# Patient Record
Sex: Male | Born: 1945 | Race: White | Hispanic: No | Smoking: Never smoker
Health system: Southern US, Community
[De-identification: ages and names within clinical notes are randomized; demographics above are authoritative.]

## PROBLEM LIST (undated history)

## (undated) DIAGNOSIS — J45909 Unspecified asthma, uncomplicated: Secondary | ICD-10-CM

## (undated) DIAGNOSIS — I10 Essential (primary) hypertension: Secondary | ICD-10-CM

## (undated) DIAGNOSIS — U071 COVID-19: Secondary | ICD-10-CM

## (undated) DIAGNOSIS — C73 Malignant neoplasm of thyroid gland: Secondary | ICD-10-CM

## (undated) DIAGNOSIS — I509 Heart failure, unspecified: Secondary | ICD-10-CM

## (undated) DIAGNOSIS — C61 Malignant neoplasm of prostate: Secondary | ICD-10-CM

## (undated) DIAGNOSIS — E785 Hyperlipidemia, unspecified: Secondary | ICD-10-CM

## (undated) DIAGNOSIS — I480 Paroxysmal atrial fibrillation: Secondary | ICD-10-CM

## (undated) HISTORY — PX: THYROIDECTOMY: SHX17

## (undated) HISTORY — PX: KNEE SURGERY: SHX244

## (undated) HISTORY — DX: Hyperlipidemia, unspecified: E78.5

## (undated) HISTORY — DX: Paroxysmal atrial fibrillation: I48.0

## (undated) HISTORY — DX: Unspecified asthma, uncomplicated: J45.909

## (undated) HISTORY — PX: PROSTATECTOMY: SHX69

---

## 2008-09-30 ENCOUNTER — Emergency Department (HOSPITAL_BASED_OUTPATIENT_CLINIC_OR_DEPARTMENT_OTHER): Admission: EM | Admit: 2008-09-30 | Discharge: 2008-09-30 | Payer: Self-pay | Admitting: Emergency Medicine

## 2008-09-30 ENCOUNTER — Ambulatory Visit: Payer: Self-pay | Admitting: Diagnostic Radiology

## 2008-11-24 ENCOUNTER — Ambulatory Visit: Payer: Self-pay | Admitting: Cardiovascular Disease

## 2008-11-24 ENCOUNTER — Ambulatory Visit: Payer: Self-pay | Admitting: Diagnostic Radiology

## 2008-11-24 ENCOUNTER — Encounter: Payer: Self-pay | Admitting: Emergency Medicine

## 2008-11-24 ENCOUNTER — Inpatient Hospital Stay (HOSPITAL_COMMUNITY): Admission: EM | Admit: 2008-11-24 | Discharge: 2008-11-26 | Payer: Self-pay | Admitting: Cardiovascular Disease

## 2008-12-03 ENCOUNTER — Ambulatory Visit: Payer: Self-pay

## 2008-12-03 ENCOUNTER — Encounter: Payer: Self-pay | Admitting: Cardiology

## 2009-01-13 ENCOUNTER — Telehealth: Payer: Self-pay | Admitting: Family Medicine

## 2009-01-14 ENCOUNTER — Inpatient Hospital Stay (HOSPITAL_COMMUNITY): Admission: EM | Admit: 2009-01-14 | Discharge: 2009-01-16 | Payer: Self-pay | Admitting: Emergency Medicine

## 2009-01-14 ENCOUNTER — Ambulatory Visit: Payer: Self-pay | Admitting: Cardiology

## 2009-01-14 ENCOUNTER — Telehealth: Payer: Self-pay | Admitting: Cardiology

## 2009-01-16 ENCOUNTER — Telehealth: Payer: Self-pay | Admitting: Cardiology

## 2009-01-18 DIAGNOSIS — C73 Malignant neoplasm of thyroid gland: Secondary | ICD-10-CM | POA: Insufficient documentation

## 2009-01-18 DIAGNOSIS — E785 Hyperlipidemia, unspecified: Secondary | ICD-10-CM | POA: Insufficient documentation

## 2009-01-18 DIAGNOSIS — I4891 Unspecified atrial fibrillation: Secondary | ICD-10-CM | POA: Insufficient documentation

## 2009-01-22 ENCOUNTER — Ambulatory Visit: Payer: Self-pay | Admitting: Cardiology

## 2009-01-22 DIAGNOSIS — F1010 Alcohol abuse, uncomplicated: Secondary | ICD-10-CM | POA: Insufficient documentation

## 2009-01-23 ENCOUNTER — Telehealth: Payer: Self-pay | Admitting: Cardiology

## 2009-03-07 ENCOUNTER — Telehealth (INDEPENDENT_AMBULATORY_CARE_PROVIDER_SITE_OTHER): Payer: Self-pay | Admitting: *Deleted

## 2009-03-08 ENCOUNTER — Telehealth: Payer: Self-pay | Admitting: Cardiology

## 2009-03-26 ENCOUNTER — Telehealth: Payer: Self-pay | Admitting: Cardiology

## 2009-04-09 ENCOUNTER — Telehealth (INDEPENDENT_AMBULATORY_CARE_PROVIDER_SITE_OTHER): Payer: Self-pay | Admitting: *Deleted

## 2009-04-19 ENCOUNTER — Telehealth: Payer: Self-pay | Admitting: Cardiology

## 2009-04-25 ENCOUNTER — Telehealth: Payer: Self-pay | Admitting: Cardiology

## 2009-04-30 ENCOUNTER — Telehealth (INDEPENDENT_AMBULATORY_CARE_PROVIDER_SITE_OTHER): Payer: Self-pay | Admitting: *Deleted

## 2009-04-30 DIAGNOSIS — R079 Chest pain, unspecified: Secondary | ICD-10-CM | POA: Insufficient documentation

## 2009-05-01 ENCOUNTER — Ambulatory Visit: Payer: Self-pay

## 2009-05-01 ENCOUNTER — Encounter: Payer: Self-pay | Admitting: Internal Medicine

## 2009-05-08 ENCOUNTER — Ambulatory Visit: Payer: Self-pay | Admitting: Cardiology

## 2009-05-21 ENCOUNTER — Telehealth: Payer: Self-pay | Admitting: Cardiology

## 2009-06-17 ENCOUNTER — Encounter: Admission: RE | Admit: 2009-06-17 | Discharge: 2009-06-17 | Payer: Self-pay | Admitting: Unknown Physician Specialty

## 2009-12-24 ENCOUNTER — Encounter: Admission: RE | Admit: 2009-12-24 | Discharge: 2009-12-24 | Payer: Self-pay | Admitting: Unknown Physician Specialty

## 2010-08-05 ENCOUNTER — Telehealth (INDEPENDENT_AMBULATORY_CARE_PROVIDER_SITE_OTHER): Payer: Self-pay | Admitting: *Deleted

## 2010-10-14 NOTE — Progress Notes (Signed)
  Request received from [facility] sent to [facility]  August 05, 2010 8:48 AM      Appended Document:  Received 2nd request from [facility] sent to [facility]...also spoke with Bronson Ing this am she was trying to figure out why no records have been sent..I advised her the 1st request that was sent I forwarded to [facility] on 08/05/10.she said she has contacted [facility] plenty of times and keeps getting the run around.I have forwarded the 2nd request to [facility].

## 2010-12-23 LAB — CBC
HCT: 42.5 % (ref 39.0–52.0)
HCT: 42.7 % (ref 39.0–52.0)
HCT: 46 % (ref 39.0–52.0)
Hemoglobin: 15 g/dL (ref 13.0–17.0)
Hemoglobin: 16.1 g/dL (ref 13.0–17.0)
MCHC: 34.4 g/dL (ref 30.0–36.0)
MCHC: 35 g/dL (ref 30.0–36.0)
MCV: 91.3 fL (ref 78.0–100.0)
MCV: 91.3 fL (ref 78.0–100.0)
MCV: 91.8 fL (ref 78.0–100.0)
Platelets: 158 10*3/uL (ref 150–400)
Platelets: 170 10*3/uL (ref 150–400)
RBC: 4.68 MIL/uL (ref 4.22–5.81)
RDW: 13.6 % (ref 11.5–15.5)
WBC: 5.1 10*3/uL (ref 4.0–10.5)

## 2010-12-23 LAB — DIFFERENTIAL
Basophils Absolute: 0 10*3/uL (ref 0.0–0.1)
Basophils Relative: 1 % (ref 0–1)
Eosinophils Absolute: 0.2 10*3/uL (ref 0.0–0.7)
Eosinophils Relative: 5 % (ref 0–5)
Lymphocytes Relative: 27 % (ref 12–46)
Lymphs Abs: 1.4 10*3/uL (ref 0.7–4.0)
Monocytes Relative: 10 % (ref 3–12)

## 2010-12-23 LAB — CK TOTAL AND CKMB (NOT AT ARMC)
CK, MB: 3.2 ng/mL (ref 0.3–4.0)
Total CK: 168 U/L (ref 7–232)
Total CK: 174 U/L (ref 7–232)
Total CK: 187 U/L (ref 7–232)

## 2010-12-23 LAB — URINALYSIS, ROUTINE W REFLEX MICROSCOPIC
Glucose, UA: NEGATIVE mg/dL
Hgb urine dipstick: NEGATIVE
Ketones, ur: NEGATIVE mg/dL
Nitrite: NEGATIVE
Protein, ur: NEGATIVE mg/dL
Urobilinogen, UA: 0.2 mg/dL (ref 0.0–1.0)
pH: 6.5 (ref 5.0–8.0)

## 2010-12-23 LAB — MAGNESIUM: Magnesium: 2.3 mg/dL (ref 1.5–2.5)

## 2010-12-23 LAB — POCT CARDIAC MARKERS: CKMB, poc: 1.5 ng/mL (ref 1.0–8.0)

## 2010-12-23 LAB — HEPARIN LEVEL (UNFRACTIONATED)
Heparin Unfractionated: 0.46 IU/mL (ref 0.30–0.70)
Heparin Unfractionated: <0.1 IU/mL — ABNORMAL LOW (ref 0.30–0.70)

## 2010-12-23 LAB — BASIC METABOLIC PANEL
BUN: 24 mg/dL — ABNORMAL HIGH (ref 6–23)
Calcium: 7.1 mg/dL — ABNORMAL LOW (ref 8.4–10.5)
Calcium: 8.1 mg/dL — ABNORMAL LOW (ref 8.4–10.5)
Chloride: 105 mEq/L (ref 96–112)
Creatinine, Ser: 1.13 mg/dL (ref 0.4–1.5)
GFR calc Af Amer: >60 mL/min (ref >60)
GFR calc Af Amer: >60 mL/min (ref >60)
Glucose, Bld: 141 mg/dL — ABNORMAL HIGH (ref 70–99)
Potassium: 3.9 mEq/L (ref 3.5–5.1)
Sodium: 138 mEq/L (ref 135–145)

## 2010-12-23 LAB — PROTIME-INR: Prothrombin Time: 13.6 seconds (ref 11.6–15.2)

## 2010-12-25 LAB — CBC
HCT: 50.5 % (ref 39.0–52.0)
Hemoglobin: 16.3 g/dL (ref 13.0–17.0)
MCHC: 34.5 g/dL (ref 30.0–36.0)
MCV: 93 fL (ref 78.0–100.0)
Platelets: 179 10*3/uL (ref 150–400)
Platelets: 141 10*3/uL — ABNORMAL LOW (ref 150–400)
RBC: 5.05 MIL/uL (ref 4.22–5.81)
RDW: 13 % (ref 11.5–15.5)
RDW: 13.5 % (ref 11.5–15.5)
WBC: 9.6 10*3/uL (ref 4.0–10.5)
WBC: 4.9 10*3/uL (ref 4.0–10.5)
WBC: 5.7 10*3/uL (ref 4.0–10.5)

## 2010-12-25 LAB — LIPID PANEL
LDL Cholesterol: 119 mg/dL — ABNORMAL HIGH (ref 0–99)
Total CHOL/HDL Ratio: 4.8 RATIO
VLDL: 30 mg/dL (ref 0–40)

## 2010-12-25 LAB — COMPREHENSIVE METABOLIC PANEL
AST: 41 U/L — ABNORMAL HIGH (ref 0–37)
Albumin: 4.8 g/dL (ref 3.5–5.2)
Alkaline Phosphatase: 75 U/L (ref 39–117)
BUN: 21 mg/dL (ref 6–23)
Chloride: 105 mEq/L (ref 96–112)
Creatinine, Ser: 1.1 mg/dL (ref 0.4–1.5)
GFR calc Af Amer: >60 mL/min (ref >60)
Potassium: 3.7 mEq/L (ref 3.5–5.1)
Total Bilirubin: 0.7 mg/dL (ref 0.3–1.2)
Total Protein: 8 g/dL (ref 6.0–8.3)

## 2010-12-25 LAB — POCT CARDIAC MARKERS
CKMB, poc: 1.6 ng/mL (ref 1.0–8.0)
Myoglobin, poc: 90.8 ng/mL (ref 12–200)
Myoglobin, poc: 97.5 ng/mL (ref 12–200)

## 2010-12-25 LAB — T4: T4, Total: 9.9 ug/dL (ref 5.0–12.5)

## 2010-12-25 LAB — URINALYSIS, ROUTINE W REFLEX MICROSCOPIC
Nitrite: NEGATIVE
Protein, ur: NEGATIVE mg/dL
Specific Gravity, Urine: 1.01 (ref 1.005–1.030)
Urobilinogen, UA: 0.2 mg/dL (ref 0.0–1.0)

## 2010-12-25 LAB — DIFFERENTIAL
Eosinophils Relative: 2 % (ref 0–5)
Lymphocytes Relative: 16 % (ref 12–46)
Monocytes Absolute: 0.7 10*3/uL (ref 0.1–1.0)
Monocytes Relative: 7 % (ref 3–12)
Neutro Abs: 6.9 10*3/uL (ref 1.7–7.7)

## 2010-12-25 LAB — BASIC METABOLIC PANEL
BUN: 19 mg/dL (ref 6–23)
CO2: 27 mEq/L (ref 19–32)
Calcium: 7.4 mg/dL — ABNORMAL LOW (ref 8.4–10.5)
Chloride: 107 mEq/L (ref 96–112)
Creatinine, Ser: 1.03 mg/dL (ref 0.4–1.5)

## 2010-12-25 LAB — CARDIAC PANEL(CRET KIN+CKTOT+MB+TROPI)
CK, MB: 3.3 ng/mL (ref 0.3–4.0)
Relative Index: 1.3 (ref 0.0–2.5)
Relative Index: 1.4 (ref 0.0–2.5)
Relative Index: 1.4 (ref 0.0–2.5)
Total CK: 182 U/L (ref 7–232)
Total CK: 233 U/L — ABNORMAL HIGH (ref 7–232)
Troponin I: <0.01 ng/mL (ref 0.00–0.06)
Troponin I: <0.01 ng/mL (ref 0.00–0.06)

## 2010-12-29 LAB — BASIC METABOLIC PANEL
Calcium: 7.6 mg/dL — ABNORMAL LOW (ref 8.4–10.5)
Creatinine, Ser: 1 mg/dL (ref 0.4–1.5)
GFR calc Af Amer: >60 mL/min (ref >60)

## 2011-01-27 NOTE — Consult Note (Signed)
NAME:  Barry, Escobar NO.:  [id number]   MEDICAL RECORD NO.:  [id number]          Escobar TYPE:  INP   LOCATION:  2923                         FACILITY:  [facility]   PHYSICIAN:  Hillis Range, MD       DATE OF BIRTH:  Oct 22, 1945   DATE OF CONSULTATION:  DATE OF DISCHARGE:  01/16/2009                                 CONSULTATION   REQUESTING PHYSICIAN:  Rollene Rotunda, MD, [facility]   REASON FOR CONSULTATION:  Atrial fibrillation and atrial flutter  management.   HISTORY OF PRESENT ILLNESS:  Barry Escobar is a pleasant 65 year old  gentleman with a history of prior thyroid cancer, status post thyroid  resection, heavy alcohol consumption, and paroxysmal atrial fibrillation  who was admitted with symptomatic atrial flutter.  Barry Escobar reports  initially being diagnosed with atrial fibrillation after presenting on  March 13 with symptomatic palpitations, shortness of breath, and  fatigue.  At that time, he was documented to have atrial fibrillation  with elevated ventricular rates.  He was initiated on a diltiazem drip  and converted to sinus rhythm spontaneously.  He was not initiated on  antiarrhythmic medications and did very well until Jan 13, 2009, when he  developed recurrent symptomatic palpitations upon waking after an  episode of heavy alcohol binging.  He notes again recurrent palpitations  with fatigue and decreased exercise tolerance.  He was brought to [facility] where he was found to have atrial flutter with rapid  ventricular rates.  He was again initiated on a diltiazem drip and  converted to sinus rhythm.  During this hospital stay, he underwent left  heart catheterization, which revealed normal coronary arteries with a  preserved ejection fraction.  He is presently in sinus rhythm and doing  well.   On further discussion with Barry Escobar, he denies any other symptomatic  episodes of atrial arrhythmias.  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  He reports that his lifestyle is  very chaotic with minimal sleep and prolonged international flying.  He  also reports significant stress at home and his relationship with his  girlfriend.  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  He is otherwise without complaint at this time.   PAST MEDICAL HISTORY:  1. Thyroid cancer in 2008, status post thyroid resection.  2. Iatrogenic hypothyroidism.  3. Paroxysmal atrial fibrillation and atrial flutter.  4. Hyperlipidemia.   ALLERGIES:  No known drug allergies.   HOME MEDICATIONS:  1. Synthroid 175 mcg daily.  2. Calcium 2 tablets b.i.d.  3. Lipitor 40 mg nightly.   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  He flies internationally.  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  He drinks heavy alcohol.  He  denies tobacco or drug use.  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   FAMILY HISTORY:  Barry Escobar's mother died at age 76 of cancer.  He is  unaware of any family history of  arrhythmias or sudden death.   REVIEW OF SYSTEMS:  All systems are reviewed and negative except as  outlined in Barry HPI above.   PHYSICAL EXAMINATION:  VITALS:  Blood pressure 116/80, heart rate 82,  respirations 18, sats 100% on room air, afebrile  GENERAL:  Barry Escobar is a well-appearing obese male in no acute  distress.  He is alert and oriented x3.  HEENT:  Normocephalic and atraumatic.  Sclerae clear.  Conjunctivae  pink.  Oropharynx clear.  NECK:  Supple.  Barry Escobar is status post thyroid resection.  No JVD or  bruits.  LUNGS:  Clear to auscultation bilaterally.  HEART:  Regular rate and rhythm.  No murmurs, rubs, or gallops.  GI:  Soft, nontender, and nondistended.  Positive bowel sounds.  EXTREMITIES:  No clubbing, cyanosis, or edema.  NEUROLOGIC:  Cranial nerve II through XII are intact.  Strength and  sensation  are intact.  SKIN:  No ecchymosis or lacerations.  MUSCULOSKELETAL:  No deformity or atrophy.  PSYCHIATRY:  Euthymic mood.  Full affect.   EKG from November 14, 2008, reveals atrial flutter with an average  ventricular rate 130 beats per minute.  Barry atrial flutter is not  completely typical in its appearance.  An EKG from March 14 also  documents atrial fibrillation.   LABORATORY DATA:  Creatinine 1.1.  Hematocrit 42 and platelets 170.  TSH  0.097   Transthoracic echocardiogram on December 03, 2008, reveals a left  ventricular end diastolic dimension 51, left atrial size 39, IVS 10,  posterior wall 9, left ventricular ejection fraction 65% with no wall  motion abnormalities.  There is mild mitral regurgitation with no  significant left atrial enlargement.   Chest x-ray which I personally reviewed from May 3 reveals no acute  airspace disease.   IMPRESSION:  Barry Escobar is a pleasant 65 year old gentleman with  paroxysmal atrial fibrillation and atrial flutter who presents today for  EP consultation regarding therapeutic strategies for atrial fibrillation  and atrial flutter.  Barry Escobar is clearly symptomatic with episodes of  atrial fibrillation and atrial flutter and has rapid ventricular rates.  His CHADS 2 score is 0.  Therefore, I think that Barry Escobar could be  adequately anticoagulated with aspirin 325 mg daily for stroke  prevention.  Therapeutic strategies for atrial fibrillation including  both medicine and catheter-based therapies were discussed in detail with  Barry Escobar today.  Risks, benefits, and alternatives to EP study and  radiofrequency ablation for atrial fibrillation were also discussed.  I  think at this point we should focus on lifestyle modification including  significant reduction in alcohol consumption as well as trying to adapt  to normal sleeping and stress patterns.  Barry Escobar has been initiated  on flecainide 50 mg twice daily and this will be  titrated in an  outpatient setting by Dr. Antoine Poche.  We will also place Barry Escobar on  Toprol-XL 25 mg daily for rate control.  Barry  Escobar will follow up closely with Dr. Antoine Poche.  If he fails medical  therapy with flecainide, then I think that he would be a reasonable  candidate for catheter ablation after lifestyle modifications have been  made.  Barry Escobar will follow up with me on an as-needed basis.      Hillis Range, MD  Electronically Signed     JA/MEDQ  D:  01/16/2009  T:  01/17/2009  Job:  [id number]   cc:   Rollene Rotunda, MD, [facility]

## 2011-01-27 NOTE — H&P (Signed)
NAME:  Barry Escobar, Barry Escobar NO.:  [id number]   MEDICAL RECORD NO.:  [id number]          PATIENT TYPE:  INP   LOCATION:  2918                         FACILITY:  [facility]   PHYSICIAN:  Christell Faith, MD   DATE OF BIRTH:  May 04, 1946   DATE OF ADMISSION:  11/24/2008  DATE OF DISCHARGE:                              HISTORY & PHYSICAL   ADMITTING PHYSICIAN:  Rollene Rotunda, MD, [facility], [facility].   PRIMARY CARE PHYSICIAN:  Dr. Sharee Pimple.   ENDOCRINOLOGIST:  Lowella Grip, MD.   WORK PHYSICIAN:  Dr. Beau Fanny in [place].   CHIEF COMPLAINT:  Chest pain and racing heart.   HISTORY OF PRESENT ILLNESS:  This is a 65 year old white man who is a  Biochemist, clinical for [employer].  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  He does have a history of thyroid cancer and has  had a total thyroidectomy and now takes Synthroid replacement.  Of note,  his dose has been fluctuating between 175 and 200 mcg a day of  Synthroid.  This morning he was running on the treadmill, and his heart  rate remained tachycardic after exercise, which is unusual for him.  He  felt funny and actually vomited.  He proceeded to go home and work in  the yard with a chain saw, but then he felt profound racing heart,  irregular heartbeat, and mild chest tightness.  He went to the [facility] and was found to be in rapid atrial  fibrillation.  His rate was slowed with IV diltiazem, and he has had no  further chest tightness.  In retrospect for the past few weeks, he has  noticed spells of racing heart, particularly after drinking a large cup  of caffeinated coffee.  He has no history of coronary disease.  As part  of his job, he takes treadmill test every few years and has never had an  abnormal treadmill test.  His last one was approximately 2 years ago.   PAST MEDICAL HISTORY:  1. Thyroid cancer, status post total thyroidectomy 2 years ago now on      thyroid  replacement hormone.  2. Hyperlipidemia.  3. Multiple prior normal treadmill tests.   SOCIAL HISTORY:  Lives in [place] with his girlfriend.  He is a  Occupational hygienist.  As mentioned, he is a Biochemist, clinical for [employer].  He is a lifelong  nonsmoker.  He has moderate-to-heavy caffeine use in the form of coffee.  He has 2 alcoholic drinks a day when not flying.  No drugs or other  stimulants.  He exercises on the treadmill 3 days a week.   FAMILY HISTORY:  Mother died of cancer.  Father died of prostate cancer.  No coronary disease or atrial fibrillation in the family that he knows  of.   ALLERGIES:  None.   MEDICINES:  1. Synthroid 175-200 mcg p.o. daily.  2. Lipitor 20 mg p.o. daily.   REVIEW OF SYSTEMS:  Positive for fatigue, otherwise 14 systems are  reviewed and are negative.   PHYSICAL EXAMINATION:  VITAL  SIGNS:  Temperature 97.2, pulse was  initially 145 and is now 81, respiratory rate 18, blood pressure was  initially 147/92 and is now 124/79, saturation 100% on 2 L.  GENERAL:  He is a very pleasant white man, in no acute distress.  HEENT:  Pupils are round and reactive.  Sclerae are clear.  Mucous  membranes are moist.  Dentition is good.  NECK:  Supple.  Neck veins are flat.  No carotid bruits.  There is a  well-healed thyroid collar scar.  No cervical adenopathy.  CARDIAC:  The rhythm is irregularly irregular, rate controlled.  No  murmur.  LUNGS:  Clear to auscultation bilaterally without wheezing or rales.  ABDOMEN:  Soft, nontender, nondistended.  No bruits.  Normal bowel  sounds.  EXTREMITIES:  No edema.  No clubbing or cyanosis.  SKIN:  No rash or erythema.  MUSCULOSKELETAL:  No acute joint effusions or deformities.  NEUROLOGIC:  Awake, alert, oriented x3.  5/5 strength in all 4  extremities.  Normal sensation.  Facial expressions are symmetric and  intact.   DIAGNOSTIC TESTS:  Chest x-ray reports a suggestion of cardiomegaly,  looks actually normal to me.  EKG, currently  atrial fibrillation 79  beats a minute with nonspecific ST and T-wave changes.  Initial EKG at  [facility] documented atrial fibrillation with a rate of 136 beats per  minute.   LABS:  White blood cell 9.6, hemoglobin 17, hematocrit 50, platelets  179.  Sodium 146, potassium 3.7, BUN 21, creatinine 1.1, glucose 93, CK-  MB 1.6, troponin less than 0.05.   IMPRESSION:  This is a 65 year old white man with new-onset atrial  fibrillation.  He had symptoms today of racing heart and chest tightness  but is now asymptomatic with rate control.   PLAN:  1. He has been admitted to the step-down unit to a monitored bed.  We      will cycle cardiac enzymes and EKGs throughout the night.  We will      plan to check a surface echocardiogram tomorrow.  2. He will be anticoagulated with Lovenox 1 mg/kg subcu q.12 h. and      will also be given aspirin.  For the time being, we will continue      to use IV diltiazem for rate control.  3. We will check TSH and free T4, and there is at least moderate      likelihood that the atrial fibrillation is related to increased      Synthroid dose recently.  4. He will need a possible TEE cardioversion on Monday if he has not      converted by then.  A decision will have to be made whether to      treat him long-term with aspirin or Coumadin.  5. He will probably require some sort of ischemic evaluation, either a      stress test or a cardiac catheterization.  6. The combination of polycythemia, hypernatremia, and BUN to      creatinine ratio makes me think he is probably dehydrated, and he      will be given IV fluids overnight tonight.  7. 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  He receives his annual flight physical by Dr.      Beau Fanny in [place] who will probably need to be contacted at      some point to determine if any specific  evaluation is required      given his occupation.     Christell Faith, MD  Electronically Signed    NDL/MEDQ  D:  11/24/2008  T:  11/25/2008  Job:  [id number]

## 2011-01-27 NOTE — Discharge Summary (Signed)
NAME:  DAERON, CARRENO NO.:  [id number]   MEDICAL RECORD NO.:  [id number]          PATIENT TYPE:  INP   LOCATION:  2923                         FACILITY[facility]   PHYSICIAN:  Hillis Range, MD       DATE OF BIRTH:  May 15, 1946   DATE OF ADMISSION:  01/14/2009  DATE OF DISCHARGE:  01/16/2009                               DISCHARGE SUMMARY   PRIMARY CARDIOLOGIST:  Rollene Rotunda, MD, FACC   ELECTROPHYSIOLOGY:  Hillis Range, MD   PRIMARY CARE Frazier Balfour:  Morton Stall.   ENDOCRINOLOGIST:  Satira Mccallum in [place], [place].   OPERATING ROOM PHYSICIAN:  Erskine Squibb in [place].   DISCHARGING DIAGNOSES:  1. Atrial fibrillation with rapid ventricular response/atrial flutter,      treated with Cardizem status post Electrophysiology evaluation by      Dr. Hillis Range this admission.  Recommendations for Toprol-XL,      flecainide therapy at this time, full strength aspirin, CHADS score      0, follow up with Dr. Antoine Poche in outpatient, recommendation also      for lifestyle modification including decreased ethyl alcohol      consumption, decreased caffeine consumption, and more effective      sleep pattern.  2. Status post cardiac catheterization this admission showing no      significant obstruction, probable normal left ventricular function,      60%.  3. Subtherapeutic TSH of 0.097 with a history of iatrogenic      hypothyroidism following a thyroid cancer/thyroidectomy.  4. Dyslipidemia, on statin therapy.   HOSPITAL COURSE:  Mr. Barry Escobar is a 65 year old Caucasian gentleman with  a new diagnosis of atrial fib in March 2010 where he spontaneously  converted.  He returned this admission.  Similar situation after  attending several social events and greater than usual EtOH intake, he  experienced some rapid heart rates, palpitations, and presented to the  emergency room for further evaluation.  He denied any episodes of chest  discomfort; however,  with a diagnosis of atrial fibrillation.  The  patient needed further evaluation to rule out coronary artery disease.  The patient was started on IV Cardizem, taken to the cath lab on Jan 15, 2009, for further evaluation.  The patient returned to normal sinus  rhythm.  Cardiac catheterization results as stated above.  The patient  tolerated the procedure without complications.  Dr. Johney Frame was asked to  consult for Electrophysiology evaluation and saw the patient in  consultation on Jan 16, 2009.  Further discussion with the patient and  Dr. Antoine Poche, Dr. Johney Frame decided to proceed with Toprol-XL 25 mg daily  and flecainide 50 mg b.i.d. with a lifestyle modification.  Follow up  with Dr. Antoine Poche and no further up with Dr. Johney Frame at this time unless  Dr. Antoine Poche deems necessary.   MEDICATIONS AT THE TIME OF DISCHARGE:  1. Toprol-XL 25 mg daily.  2. Flecainide 50 mg b.i.d.  3. Aspirin 325 mg daily.  4. Levothyroxine, note will continue the 175 mcg daily now.  The      patient needs to follow  up with Dr. Jimmye Norman for adjustment of his      medication.  5. Lipitor 20 mg daily.   The patient has been given the postcardiac catheterization discharge  instructions.   OTHER PERTINENT LABORATORY WORK AT THE TIME OF DISCHARGE:  Potassium  3.9, glucose 114, creatinine 1.13.  TSH of 0.097 as stated.  Urinalysis  was negative.  Magnesium 2.3.  WBC 6.2, hemoglobin 14.6, hematocrit  42.5, platelet count 170,000.   DURATION OF DISCHARGE ENCOUNTER:  30 minutes.      Dorian Pod, ACNP      Hillis Range, MD  Electronically Signed    MB/MEDQ  D:  01/16/2009  T:  01/17/2009  Job:  [id number]   cc:   Dr. Harl Bowie  Dr. Satira Mccallum  Dr Alisa Graff, MD, [facility]

## 2011-01-27 NOTE — Cardiovascular Report (Signed)
NAMEARNEL, WYMER NO.:  [id number]   MEDICAL RECORD NO.:  [id number]          PATIENT TYPE:  INP   LOCATION:  2923                         [facility]ACILITY[facility]   PHYSICIAN:  Arturo Morton. Riley Kill, MD, FACCDATE OF BIRTH:  October 16, 1945   DATE OF PROCEDURE:  01/15/2009  DATE OF DISCHARGE:                            CARDIAC CATHETERIZATION   INDICATIONS:  Mr. Monte is a 65 year old gentleman who has presented  with palpitations and malaise.  He was noted to be in atrial flutter.  He has previously been in atrial fib.  Echo has revealed normal ejection  fraction.  He was seen by Dr. Myrtis Ser and Dr. Antoine Poche and cardiac  catheterization was recommended.  Risks, benefits, and alternatives were  discussed with the patient in detail.  He consented to proceed.   PROCEDURES:  1. Left heart catheterization.  2. Selective coronary arteriography.  3. Selective left ventriculography.   DESCRIPTION OF PROCEDURE:  The patient was brought to the  catheterization laboratory and prepped and draped in the usual fashion.  Through an anterior puncture, the right femoral artery was easily  entered.  A 5-French sheath was then placed.  Standard Judkins catheter  was utilized to engage the left coronary artery.  The right coronary  artery was opacified using a [employer] Ship.  This was nonselectively  engaged, but opacification was excellent.  Central aortic and left  ventricular pressures were measured with a pigtail catheter and  ventriculography was performed in the RAO projection.  ACT was 197  seconds and therefore the femoral sheath was sewn into place and he was  taken to the holding area for sheath removal in approximately 30  minutes.  There were no major complications.  He tolerated the procedure  well, 1 mg of intravenous midazolam, and 25 mg of fentanyl were given  for sedation.   HEMODYNAMIC DATA:  1. The central aortic pressure was 114/69.  2. Left ventricular pressure  118/3.  3. There was no gradient or pullback across the aortic valve.   ANGIOGRAPHIC DATA:  1. On plain fluoroscopy, there was no evidence of any calcification.  2. Ventriculography done in the RAO projection reveals vigorous global      systolic function.  It is difficult to evaluate wall motion because      the patient is in atrial flutter.  Ejection fraction could not be      calculated precisely because of this.  3. The left main is large and free of disease.  4. The LAD courses to the apex.  There are 2 smaller diagonal branches      associated with the LAD.  I do not see any significant      calcification.  The apical LAD is without significant narrowing.      The two small diagonals likewise are without significant narrowing.  5. The circumflex is a large-caliber vessel.  It is a codominant      vessel providing 2 large posterior branches, which occupied a      significant portion of the inferior wall.  There is a third  additional branch.  There are 2 more proximal marginals all are      without significant disease.  6. The right coronary artery is a small codominant vessel providing a      single PDA.  It is without significant narrowing.   CONCLUSIONS:  1. Atrial flutter with good control of ventricular rate presently on      intravenous diltiazem.  2. Preserved overall LV systolic function.  3. No significant evidence of high-grade focal coronary obstruction.   DISPOSITION:  The patient will be taken back to the holding area in  approximately 12 hours.  He will be started back on fairly low-dose  anticoagulation.  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      Arturo Morton. Riley Kill, MD, [facility]  Electronically Signed     TDS/MEDQ  D:  01/15/2009  T:  01/16/2009  Job:  [id number]   cc:   Rollene Rotunda, MD, [facility]  Dr. Harl Bowie  CV Laboratory  Luis Abed, MD, [facility]

## 2011-01-27 NOTE — Discharge Summary (Signed)
NAME:  Barry Escobar, Barry Escobar NO.:  [id number]   MEDICAL RECORD NO.:  [id number]          PATIENT TYPE:  INP   LOCATION:  3738                         FACILITY:  [facility]   PHYSICIAN:  Rollene Rotunda, MD, FACCDATE OF BIRTH:  08/25/1946   DATE OF ADMISSION:  11/24/2008  DATE OF DISCHARGE:  11/26/2008                               DISCHARGE SUMMARY   PRIMARY CARDIOLOGIST:  Rollene Rotunda, MD, [facility]   PRIMARY CARE PHYSICIAN:  Harl Bowie, MD   ENDOCRINOLOGIST:  Satira Mccallum, MD   WORK PHYSICIAN:  Erskine Squibb, MD, [place]   DISCHARGE DIAGNOSES:  1. Atrial fibrillation.  2. Chest pain (ruled out for acute coronary syndrome).   SECONDARY DIAGNOSES:  1. Hypothyroidism.  2. Hyperlipidemia.   ALLERGIES:  NKDA.   PROCEDURES PERFORMED DURING THIS HOSPITALIZATION:  On November 24, 2008,  the patient had EKG performed that showed atrial fibrillation with  ventricular response of 79 beats per minute, no acute ST-T wave changes,  no significant Q-waves, left ventricular hypertrophy, and left axis  deviation.  The patient had an EKG performed on November 24, 2008 that  showed probable mild cardiomegaly.  The patient had EKG performed on  November 25, 2008 that showed atrial fibrillation with a ventricular  response rate of 57 beats per minute, otherwise no significant changes  from prior EKG.   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  He has a  history of thyroid cancer and is status post thyroidectomy and not takes  Synthroid replacement.  Of note, his dose has been fluctuating between  135 and 200 mcg a day of Synthroid.  This morning, he was running on the  treadmill and his heart rate remained elevated after cessation of  exercise.  The patient reports this was felt funny and was unusual.  The  patient also reported  vomiting.  He proceeded to go home and worked in  the yard with a chain saw when he felt profound tachycardia and an  irregular heartbeat with mild chest tightness.  He went to the [facility] and was found to be in rapid atrial  fibrillation.  His rate was slowed with IV diltiazem and he has had no  further chest tightness since.  In retrospect with past few weeks, he  has noticed spells of tachy palpitations, particularly after drinking a  large cup of caffeinated coffee.  He has no history of coronary artery  disease.  As part of his job, he takes a treadmill test every few years  and has never had an abnormal result.  His last one was approximately 2  years ago.   HOSPITAL COURSE:  The patient was admitted on November 24, 2008 as above  and had 3 sets of negative cardiac enzymes as well as his thyroid  function tests showing decreased TSH with normal free T4/thyroxine and  T3.  The patient's other labs including CBC and CMET were  within normal  limits except for a slightly elevated AST on admission.  The patient did  have slightly elevated CK values with his first two full sets of cardiac  enzymes.  Vital signs remained stable during hospital course and on  November 25, 2008, the patient spontaneously converted from atrial  fibrillation to normal sinus rhythm.  The patient remained asymptomatic  since admission and throughout hospital course.  The patient was deemed  stable for discharge and given followup appointments (see followup  appointment section) in both oral and written form as well as his new  medication list.  At the time of discharge, the patient had no questions  or concerns that had not been addressed.   DISCHARGE VITAL SIGNS:  Most recent vital signs on day of discharge:  Temperature 97.7 degrees Fahrenheit, BP 103/65, pulse 69, respiration  rate 16, O2 saturation 94% on room air, and weight 89.9 kg.   DISCHARGE LABORATORY DATA:  WBC 4.9, HGB 15.1, HCT  43.8, and PLT count  141.  Sodium 139, potassium 3.7, chloride 107, CO2 27, BUN 19,  creatinine 1.03 and glucose 86.  Calcium was 7.4.  The patient's  thyroxine was 9.9 and free T4 1.49.  The patient's T3/triiodothyronine  was 113.0 and TSH was 0.171.  The patient had a urinalysis that was  within normal limits.  Total cholesterol 188, triglycerides 150, HDL 39,  LDL 119, and VLDL 30.  Three sets of negative cardiac enzymes with CK as  high as 257 on first measure.   FOLLOWUP PLANS AND APPOINTMENTS:  1. The patient will return to [facility] on December 03, 2008 at      3:00 p.m. for an echocardiogram.  2. Follow up with Dr. Antoine Poche on December 04, 2008 and 2:00 p.m. for      office visit and possible treadmill stress test without Myoview.  3. The patient has been encouraged to followup with his primary care      Mariateresa Batra and his endocrinologist as soon as possible regarding      treatment of his hyperlipidemia and thyroid dysfunction.   DISCHARGE MEDICATIONS:  1. Synthroid 175 mcg p.o. daily.  2. Lipitor 20 mg p.o. daily.  3. Enteric-coated aspirin 325 mg p.o. daily.   DURATION OF DISCHARGE ENCOUNTER:  Including physician time was 35  minutes.      Jarrett Ables, [facility]      Rollene Rotunda, MD, [facility]  Electronically Signed    MS/MEDQ  D:  11/26/2008  T:  11/26/2008  Job:  [id number]   cc:   Harl Bowie, M.D.  Satira Mccallum, M.D.  Erskine Squibb, M.D.

## 2011-01-27 NOTE — Cardiovascular Report (Signed)
Barry Escobar, Barry Escobar NO.:  [id number]   MEDICAL RECORD NO.:  [id number]          PATIENT TYPE:  INP   LOCATION:  2923                         FACILITY:  [facility]   PHYSICIAN:  Arturo Morton. Riley Kill, MD, FACCDATE OF BIRTH:  1946/06/15   DATE OF PROCEDURE:  01/15/2009  DATE OF DISCHARGE:                            CARDIAC CATHETERIZATION   INDICATIONS:  [name] is a 65 year old gentleman who presents with  malaise and irregular heart rate.  He was noted to be in atrial flutter.  He had a slight dullness in his chest.  Previous echocardiogram done in  March revealed EF of 65% with mild AR, mild MR, neither thought to be  significant.  The current study was done to assess coronary anatomy, set  up by Dr. Antoine Poche.  Left heart catheterization was recommended.  The  patient had paroxysmal atrial fibrillation.  Because of his chest pain,  it was felt that we needed to know his coronary anatomy.   PROCEDURES:  1. Left heart catheterization  2. Selective coronary arteriography.  3. Selective left ventriculography.   DESCRIPTION OF PROCEDURE:  The patient was brought to the  catheterization laboratory and prepped and draped in the usual fashion.  Through an anterior puncture, the right femoral artery was easily  entered and ACT was then checked and found to be about 197.  Therefore,  the sheath was not removed at the completion of the procedure.  Standard  Judkins catheters were used for the left coronary artery.  A [employer] Ship coronary catheter was used for the right vessel.  The catheter  sat just outside the coronary ostium, so there was excellent  opacification without actual engagement.  Central aortic and left  ventricular pressures were measured with a pigtail and ventriculography  was performed in the RAO projection.  There were no complications.      Arturo Morton. Riley Kill, MD, [facility]  Electronically Signed     TDS/MEDQ  D:  01/15/2009  T:  01/16/2009   Job:  [id number]

## 2011-01-27 NOTE — H&P (Signed)
NAME:  Barry Escobar, Barry Escobar NO.:  [id number]   MEDICAL RECORD NO.:  [id number]          PATIENT TYPE:  INP   LOCATION:  2923                         FACILITY:  [facility]   PHYSICIAN:  Barry Abed, MD, FACCDATE OF BIRTH:  October 10, 1945   DATE OF ADMISSION:  01/14/2009  DATE OF DISCHARGE:                              HISTORY & PHYSICAL   PRIMARY CARE PHYSICIAN:  Barry Escobar.   PRIMARY CARDIOLOGIST:  Dr. Rollene Rotunda, MD, [facility]   CHIEF COMPLAINT:  Atrial flutter.   HISTORY OF PRESENT ILLNESS:  Barry Escobar is a 65 year old male with a  history of atrial fibrillation first diagnosed in March 2010.  He was in  his usual state of health last week, but attended several social events  and therefore had greater than usual EtOH intake.  Additionally, because  of travel, his sleep cycle was greatly disturbed.  His caffeine intake  was higher than usual as well.  On Jan 13, 2009, he played with his  grandson in the morning and approximately 4:00 p.m. that day he noted  general malaise and an irregular heart rate.  Later that evening, he  also had chest discomfort described as an ache at 2/10.  This was  associated with slight shortness of breath.  To treat this, he took  several aspirins.  On Jan 14, 2009, his symptoms were no better.  He  called his primary MD and came to the ER as requested.  In the emergency  room, he was started on IV diltiazem and once his heart rate decreased  his chest pain resolved.  Additionally, once his heart rate became more  normal, he had no shortness of breath.  Currently, his heart rate is in  the 90s to 100s and he is resting comfortably.   PAST MEDICAL HISTORY:  1. Atrial fibrillation diagnosed March 2010, spontaneous conversion to      sinus rhythm.  2. Hyperlipidemia.  3. Status post echocardiogram December 03, 2008 showing an EF of 65%,      mild AR, mild MR with a volume of 17 mL.  4. History of thyroid cancer.  5. Iatrogenic  hypothyroidism.   SURGICAL HISTORY:  He is status post thyroidectomy and colonoscopy.   ALLERGIES:  No known drug allergies.   CURRENT MEDICATIONS:  1. Synthroid 175 mcg daily.  2. Lipitor 20 mg a day.  3. Aspirin 325 mg daily.   SOCIAL HISTORY:  Lives in [place] with his girlfriend.  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  He has no history of  alcohol or drug abuse.  He drinks 4-5 servings of caffeine daily.   FAMILY HISTORY:  His mother died at age 38 of cancer, but no heart  disease and his father died at age 34 also of cancer with no heart  disease, and his  siblings have coronary artery disease.   REVIEW OF SYSTEMS:  He has had no fevers, chills, sweats, or weight  changes.  He has not had any headache or upper respiratory problems.  The chest pain was associated with shortness  of breath, but he has not  had dyspnea on exertion, orthopnea, PND, or edema.  He exercises  regularly and has noted his heart rate to be elevated at the beginning  of exercise and also noted that his heart rate was above its usual range  when he was exercising.  Additionally, he had some easy fatigability,  and he was not able to exercise strenuously recently.  This has been  going on over the last month, but he did not check a pulse, so he does  not know if his heart rate was irregular or not.  He did not experience  palpitations; however, until yesterday.  The elevated heart rate with a  resting heart rate is approximately 100 has been going on for about a  month.  He has had bright red blood per rectum, but denies melena.  He  has been told he has hemorrhoids.  Full 14-point review of systems is  otherwise negative.   PHYSICAL EXAMINATION:  VITAL SIGNS:  Temperature is 97.1, blood pressure  133/88, pulse 124, respiratory rate 18, O2 saturation 96% on room air.  GENERAL:  He is a well-developed, well-nourished white male, in no acute  distress.  HEENT:  Normal.  NECK:  There  is no lymphadenopathy, thyromegaly, bruit, or JVD noted.  CV:  His heart is irregular in rate and rhythm with an S1 and S2 and no  significant murmur, rub, or gallop is noted.  Distal pulses are intact  in all 4 extremities and no femoral bruits are appreciated.  LUNGS:  Clear to auscultation bilaterally.  SKIN:  No rashes or lesions are noted.  ABDOMEN:  Soft, nontender with active bowel sounds.  EXTREMITIES:  There is no cyanosis, clubbing, or edema noted.  MUSCULOSKELETAL:  There is no joint deformity or effusions and no spine  or CVA tenderness.  NEURO:  He is alert and oriented.  Cranial nerves II through XII grossly  intact.   Chest x-ray, no acute disease.   EKG is atrial flutter rate 126 with no acute ischemic changes.   Laboratory values are pending at the time of dictation, but point care  markers are negative x1 and BNP is 230.   IMPRESSION:  Barry Escobar was seen today by Dr. Myrtis Ser.  He had an episode  of paroxysmal atrial fibrillation on November 24, 2008 that spontaneously  conferred to sinus rhythm.  He has not seen Barry Escobar, since he left  the hospital, but he has had a 2-D echocardiogram since then with a  normal EF.  Now, he has atrial flutter.  His TSH as well as free T3 and  T4 were normal in March 2010.  He had chest pain, but his chest  tightness may be secondary to the atrial fibrillation and flutter.  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  He has been started on IV Cardizem and this will be titrated  for better rate control.  He will be taken to the cath lab on Jan 15, 2009 to assess for coronary artery disease.  At that time, therapy for  atrial fibrillation and flutter can be decided and he will likely need  EP consult for definitive treatment.       Barry Demark, PA-C      Barry Abed, MD, [facility]  Electronically Signed    RB/MEDQ  D:  01/15/2009  T:  01/16/2009  Job:  [id number]

## 2011-07-13 ENCOUNTER — Other Ambulatory Visit: Payer: Self-pay | Admitting: Family Medicine

## 2011-07-13 ENCOUNTER — Ambulatory Visit
Admission: RE | Admit: 2011-07-13 | Discharge: 2011-07-13 | Disposition: A | Discharge status: Home / Self Care | Payer: Self-pay | Source: Ambulatory Visit | Attending: Family Medicine | Admitting: Family Medicine

## 2011-07-13 DIAGNOSIS — T1490XA Injury, unspecified, initial encounter: Secondary | ICD-10-CM

## 2011-07-13 DIAGNOSIS — R52 Pain, unspecified: Secondary | ICD-10-CM

## 2012-05-22 ENCOUNTER — Other Ambulatory Visit (HOSPITAL_COMMUNITY): Payer: Self-pay | Admitting: Internal Medicine

## 2012-05-22 DIAGNOSIS — I4891 Unspecified atrial fibrillation: Secondary | ICD-10-CM

## 2012-05-25 MED ORDER — FLECAINIDE ACETATE 50 MG PO TABS: 50.0000 mg | ORAL_TABLET | Freq: Two times a day (BID) | ORAL | Status: AC

## 2013-01-10 ENCOUNTER — Other Ambulatory Visit: Payer: Self-pay | Admitting: Family Medicine

## 2013-01-10 ENCOUNTER — Ambulatory Visit (INDEPENDENT_AMBULATORY_CARE_PROVIDER_SITE_OTHER): Payer: Medicare HMO

## 2013-01-10 DIAGNOSIS — S61209A Unspecified open wound of unspecified finger without damage to nail, initial encounter: Secondary | ICD-10-CM

## 2013-01-10 DIAGNOSIS — X58XXXA Exposure to other specified factors, initial encounter: Secondary | ICD-10-CM

## 2013-01-10 DIAGNOSIS — M79609 Pain in unspecified limb: Secondary | ICD-10-CM

## 2013-01-10 DIAGNOSIS — M795 Residual foreign body in soft tissue: Secondary | ICD-10-CM

## 2013-08-04 ENCOUNTER — Other Ambulatory Visit (HOSPITAL_COMMUNITY): Payer: Self-pay | Admitting: Internal Medicine

## 2013-08-16 NOTE — Telephone Encounter (Signed)
Rose, this is Dr Hochrein's patient last seen by him 05/08/09

## 2013-08-22 NOTE — Telephone Encounter (Signed)
Call pt and left message to obtain refills from MD he has been seeing.  We have not refilled med since 2013.  Requested he call back to schedule an appointment if he needs [place] to refill.

## 2014-02-06 ENCOUNTER — Encounter: Payer: Self-pay | Admitting: Cardiology

## 2014-04-16 ENCOUNTER — Other Ambulatory Visit: Payer: Self-pay | Admitting: Family Medicine

## 2014-04-16 ENCOUNTER — Ambulatory Visit (INDEPENDENT_AMBULATORY_CARE_PROVIDER_SITE_OTHER): Payer: Medicare HMO

## 2014-04-16 DIAGNOSIS — R0602 Shortness of breath: Secondary | ICD-10-CM

## 2014-04-16 DIAGNOSIS — R05 Cough: Secondary | ICD-10-CM

## 2014-04-16 DIAGNOSIS — R059 Cough, unspecified: Secondary | ICD-10-CM

## 2014-09-20 DIAGNOSIS — I499 Cardiac arrhythmia, unspecified: Secondary | ICD-10-CM | POA: Diagnosis not present

## 2014-09-20 DIAGNOSIS — Z9889 Other specified postprocedural states: Secondary | ICD-10-CM | POA: Diagnosis not present

## 2014-09-20 DIAGNOSIS — E890 Postprocedural hypothyroidism: Secondary | ICD-10-CM | POA: Diagnosis not present

## 2014-09-20 DIAGNOSIS — E785 Hyperlipidemia, unspecified: Secondary | ICD-10-CM | POA: Diagnosis not present

## 2014-09-20 DIAGNOSIS — Z85850 Personal history of malignant neoplasm of thyroid: Secondary | ICD-10-CM | POA: Diagnosis not present

## 2014-09-20 DIAGNOSIS — I4891 Unspecified atrial fibrillation: Secondary | ICD-10-CM | POA: Diagnosis not present

## 2014-09-20 DIAGNOSIS — I4892 Unspecified atrial flutter: Secondary | ICD-10-CM | POA: Diagnosis not present

## 2014-09-21 DIAGNOSIS — Z7982 Long term (current) use of aspirin: Secondary | ICD-10-CM | POA: Diagnosis not present

## 2014-09-21 DIAGNOSIS — Z85850 Personal history of malignant neoplasm of thyroid: Secondary | ICD-10-CM | POA: Diagnosis not present

## 2014-09-21 DIAGNOSIS — Z79899 Other long term (current) drug therapy: Secondary | ICD-10-CM | POA: Diagnosis not present

## 2014-09-21 DIAGNOSIS — N202 Calculus of kidney with calculus of ureter: Secondary | ICD-10-CM | POA: Diagnosis not present

## 2014-09-21 DIAGNOSIS — N200 Calculus of kidney: Secondary | ICD-10-CM | POA: Diagnosis not present

## 2014-09-21 DIAGNOSIS — N201 Calculus of ureter: Secondary | ICD-10-CM | POA: Diagnosis not present

## 2014-09-21 DIAGNOSIS — N400 Enlarged prostate without lower urinary tract symptoms: Secondary | ICD-10-CM | POA: Diagnosis not present

## 2014-11-06 DIAGNOSIS — E890 Postprocedural hypothyroidism: Secondary | ICD-10-CM | POA: Diagnosis not present

## 2014-11-06 DIAGNOSIS — R7309 Other abnormal glucose: Secondary | ICD-10-CM | POA: Diagnosis not present

## 2014-11-06 DIAGNOSIS — I2510 Atherosclerotic heart disease of native coronary artery without angina pectoris: Secondary | ICD-10-CM | POA: Diagnosis not present

## 2014-11-06 DIAGNOSIS — E780 Pure hypercholesterolemia: Secondary | ICD-10-CM | POA: Diagnosis not present

## 2014-11-06 DIAGNOSIS — N400 Enlarged prostate without lower urinary tract symptoms: Secondary | ICD-10-CM | POA: Diagnosis not present

## 2014-11-13 DIAGNOSIS — I480 Paroxysmal atrial fibrillation: Secondary | ICD-10-CM | POA: Diagnosis not present

## 2014-11-13 DIAGNOSIS — Z9889 Other specified postprocedural states: Secondary | ICD-10-CM | POA: Diagnosis not present

## 2014-11-13 DIAGNOSIS — I4891 Unspecified atrial fibrillation: Secondary | ICD-10-CM | POA: Diagnosis not present

## 2015-01-28 DIAGNOSIS — E209 Hypoparathyroidism, unspecified: Secondary | ICD-10-CM | POA: Diagnosis not present

## 2015-01-28 DIAGNOSIS — C73 Malignant neoplasm of thyroid gland: Secondary | ICD-10-CM | POA: Diagnosis not present

## 2015-01-28 DIAGNOSIS — E890 Postprocedural hypothyroidism: Secondary | ICD-10-CM | POA: Diagnosis not present

## 2015-05-22 DIAGNOSIS — R7309 Other abnormal glucose: Secondary | ICD-10-CM | POA: Diagnosis not present

## 2015-05-22 DIAGNOSIS — Z86010 Personal history of colonic polyps: Secondary | ICD-10-CM | POA: Diagnosis not present

## 2015-05-22 DIAGNOSIS — Z23 Encounter for immunization: Secondary | ICD-10-CM | POA: Diagnosis not present

## 2015-05-22 DIAGNOSIS — I2510 Atherosclerotic heart disease of native coronary artery without angina pectoris: Secondary | ICD-10-CM | POA: Diagnosis not present

## 2015-05-22 DIAGNOSIS — E780 Pure hypercholesterolemia: Secondary | ICD-10-CM | POA: Diagnosis not present

## 2015-05-22 DIAGNOSIS — I480 Paroxysmal atrial fibrillation: Secondary | ICD-10-CM | POA: Diagnosis not present

## 2015-05-22 DIAGNOSIS — G479 Sleep disorder, unspecified: Secondary | ICD-10-CM | POA: Diagnosis not present

## 2015-05-22 DIAGNOSIS — N400 Enlarged prostate without lower urinary tract symptoms: Secondary | ICD-10-CM | POA: Diagnosis not present

## 2015-06-12 DIAGNOSIS — M791 Myalgia: Secondary | ICD-10-CM | POA: Diagnosis not present

## 2015-06-12 DIAGNOSIS — M25511 Pain in right shoulder: Secondary | ICD-10-CM | POA: Diagnosis not present

## 2015-09-13 DIAGNOSIS — H938X1 Other specified disorders of right ear: Secondary | ICD-10-CM | POA: Diagnosis not present

## 2015-10-08 DIAGNOSIS — D2372 Other benign neoplasm of skin of left lower limb, including hip: Secondary | ICD-10-CM | POA: Diagnosis not present

## 2015-10-08 DIAGNOSIS — D485 Neoplasm of uncertain behavior of skin: Secondary | ICD-10-CM | POA: Diagnosis not present

## 2015-10-08 DIAGNOSIS — D2239 Melanocytic nevi of other parts of face: Secondary | ICD-10-CM | POA: Diagnosis not present

## 2015-10-29 ENCOUNTER — Ambulatory Visit (INDEPENDENT_AMBULATORY_CARE_PROVIDER_SITE_OTHER): Payer: Medicare HMO

## 2015-10-29 ENCOUNTER — Other Ambulatory Visit: Payer: Self-pay | Admitting: Family Medicine

## 2015-10-29 DIAGNOSIS — S99921A Unspecified injury of right foot, initial encounter: Secondary | ICD-10-CM | POA: Diagnosis not present

## 2015-10-29 DIAGNOSIS — S99922A Unspecified injury of left foot, initial encounter: Secondary | ICD-10-CM | POA: Diagnosis not present

## 2015-10-29 DIAGNOSIS — R52 Pain, unspecified: Secondary | ICD-10-CM

## 2015-10-29 DIAGNOSIS — M79671 Pain in right foot: Secondary | ICD-10-CM

## 2015-10-29 DIAGNOSIS — M79672 Pain in left foot: Secondary | ICD-10-CM | POA: Diagnosis not present

## 2015-10-30 DIAGNOSIS — M79671 Pain in right foot: Secondary | ICD-10-CM | POA: Diagnosis not present

## 2015-10-30 DIAGNOSIS — S92315A Nondisplaced fracture of first metatarsal bone, left foot, initial encounter for closed fracture: Secondary | ICD-10-CM | POA: Diagnosis not present

## 2015-10-30 DIAGNOSIS — S92514A Nondisplaced fracture of proximal phalanx of right lesser toe(s), initial encounter for closed fracture: Secondary | ICD-10-CM | POA: Diagnosis not present

## 2015-11-04 DIAGNOSIS — S92315S Nondisplaced fracture of first metatarsal bone, left foot, sequela: Secondary | ICD-10-CM | POA: Diagnosis not present

## 2015-11-04 DIAGNOSIS — S92514S Nondisplaced fracture of proximal phalanx of right lesser toe(s), sequela: Secondary | ICD-10-CM | POA: Diagnosis not present

## 2015-11-04 DIAGNOSIS — M79673 Pain in unspecified foot: Secondary | ICD-10-CM | POA: Diagnosis not present

## 2015-11-13 DIAGNOSIS — R000 Tachycardia, unspecified: Secondary | ICD-10-CM | POA: Diagnosis not present

## 2015-11-13 DIAGNOSIS — I480 Paroxysmal atrial fibrillation: Secondary | ICD-10-CM | POA: Diagnosis not present

## 2015-11-13 DIAGNOSIS — E669 Obesity, unspecified: Secondary | ICD-10-CM | POA: Diagnosis not present

## 2015-11-13 DIAGNOSIS — Z9889 Other specified postprocedural states: Secondary | ICD-10-CM | POA: Diagnosis not present

## 2015-11-13 DIAGNOSIS — Z7982 Long term (current) use of aspirin: Secondary | ICD-10-CM | POA: Diagnosis not present

## 2015-11-13 DIAGNOSIS — I4891 Unspecified atrial fibrillation: Secondary | ICD-10-CM | POA: Diagnosis not present

## 2015-11-13 DIAGNOSIS — Z9089 Acquired absence of other organs: Secondary | ICD-10-CM | POA: Diagnosis not present

## 2015-11-13 DIAGNOSIS — Z6833 Body mass index (BMI) 33.0-33.9, adult: Secondary | ICD-10-CM | POA: Diagnosis not present

## 2015-11-13 DIAGNOSIS — Z79899 Other long term (current) drug therapy: Secondary | ICD-10-CM | POA: Diagnosis not present

## 2015-11-13 DIAGNOSIS — Z85850 Personal history of malignant neoplasm of thyroid: Secondary | ICD-10-CM | POA: Diagnosis not present

## 2015-11-19 DIAGNOSIS — R000 Tachycardia, unspecified: Secondary | ICD-10-CM | POA: Diagnosis not present

## 2015-12-12 DIAGNOSIS — G479 Sleep disorder, unspecified: Secondary | ICD-10-CM | POA: Diagnosis not present

## 2015-12-12 DIAGNOSIS — J9801 Acute bronchospasm: Secondary | ICD-10-CM | POA: Diagnosis not present

## 2015-12-12 DIAGNOSIS — E786 Lipoprotein deficiency: Secondary | ICD-10-CM | POA: Diagnosis not present

## 2015-12-12 DIAGNOSIS — E291 Testicular hypofunction: Secondary | ICD-10-CM | POA: Diagnosis not present

## 2015-12-12 DIAGNOSIS — R05 Cough: Secondary | ICD-10-CM | POA: Diagnosis not present

## 2015-12-12 DIAGNOSIS — R5383 Other fatigue: Secondary | ICD-10-CM | POA: Diagnosis not present

## 2015-12-12 DIAGNOSIS — Z8249 Family history of ischemic heart disease and other diseases of the circulatory system: Secondary | ICD-10-CM | POA: Diagnosis not present

## 2015-12-12 DIAGNOSIS — E7800 Pure hypercholesterolemia, unspecified: Secondary | ICD-10-CM | POA: Diagnosis not present

## 2015-12-12 DIAGNOSIS — R739 Hyperglycemia, unspecified: Secondary | ICD-10-CM | POA: Diagnosis not present

## 2015-12-18 DIAGNOSIS — E892 Postprocedural hypoparathyroidism: Secondary | ICD-10-CM | POA: Diagnosis not present

## 2015-12-18 DIAGNOSIS — E890 Postprocedural hypothyroidism: Secondary | ICD-10-CM | POA: Diagnosis not present

## 2015-12-18 DIAGNOSIS — C73 Malignant neoplasm of thyroid gland: Secondary | ICD-10-CM | POA: Diagnosis not present

## 2015-12-27 DIAGNOSIS — T783XXA Angioneurotic edema, initial encounter: Secondary | ICD-10-CM | POA: Diagnosis not present

## 2015-12-27 DIAGNOSIS — T17228A Food in pharynx causing other injury, initial encounter: Secondary | ICD-10-CM | POA: Diagnosis not present

## 2015-12-27 DIAGNOSIS — R0989 Other specified symptoms and signs involving the circulatory and respiratory systems: Secondary | ICD-10-CM | POA: Diagnosis not present

## 2015-12-27 DIAGNOSIS — R0689 Other abnormalities of breathing: Secondary | ICD-10-CM | POA: Diagnosis not present

## 2015-12-27 DIAGNOSIS — R0600 Dyspnea, unspecified: Secondary | ICD-10-CM | POA: Diagnosis not present

## 2016-03-11 DIAGNOSIS — Z08 Encounter for follow-up examination after completed treatment for malignant neoplasm: Secondary | ICD-10-CM | POA: Diagnosis not present

## 2016-03-11 DIAGNOSIS — Z85828 Personal history of other malignant neoplasm of skin: Secondary | ICD-10-CM | POA: Diagnosis not present

## 2016-03-11 DIAGNOSIS — L570 Actinic keratosis: Secondary | ICD-10-CM | POA: Diagnosis not present

## 2016-03-26 DIAGNOSIS — R490 Dysphonia: Secondary | ICD-10-CM | POA: Diagnosis not present

## 2016-03-26 DIAGNOSIS — J3801 Paralysis of vocal cords and larynx, unilateral: Secondary | ICD-10-CM | POA: Diagnosis not present

## 2016-03-26 DIAGNOSIS — J383 Other diseases of vocal cords: Secondary | ICD-10-CM | POA: Diagnosis not present

## 2016-04-03 DIAGNOSIS — R1310 Dysphagia, unspecified: Secondary | ICD-10-CM | POA: Diagnosis not present

## 2016-04-03 DIAGNOSIS — R4702 Dysphasia: Secondary | ICD-10-CM | POA: Diagnosis not present

## 2016-04-03 DIAGNOSIS — Z85850 Personal history of malignant neoplasm of thyroid: Secondary | ICD-10-CM | POA: Diagnosis not present

## 2016-04-14 DIAGNOSIS — J3801 Paralysis of vocal cords and larynx, unilateral: Secondary | ICD-10-CM | POA: Diagnosis not present

## 2016-04-14 DIAGNOSIS — J3800 Paralysis of vocal cords and larynx, unspecified: Secondary | ICD-10-CM | POA: Diagnosis not present

## 2016-05-06 DIAGNOSIS — H1045 Other chronic allergic conjunctivitis: Secondary | ICD-10-CM | POA: Diagnosis not present

## 2016-05-07 DIAGNOSIS — J383 Other diseases of vocal cords: Secondary | ICD-10-CM | POA: Diagnosis not present

## 2016-05-07 DIAGNOSIS — R490 Dysphonia: Secondary | ICD-10-CM | POA: Diagnosis not present

## 2016-05-25 DIAGNOSIS — J383 Other diseases of vocal cords: Secondary | ICD-10-CM | POA: Diagnosis not present

## 2016-05-25 DIAGNOSIS — R490 Dysphonia: Secondary | ICD-10-CM | POA: Diagnosis not present

## 2016-06-09 DIAGNOSIS — R490 Dysphonia: Secondary | ICD-10-CM | POA: Diagnosis not present

## 2016-06-09 DIAGNOSIS — J383 Other diseases of vocal cords: Secondary | ICD-10-CM | POA: Diagnosis not present

## 2016-06-26 DIAGNOSIS — L309 Dermatitis, unspecified: Secondary | ICD-10-CM | POA: Diagnosis not present

## 2016-06-26 DIAGNOSIS — G479 Sleep disorder, unspecified: Secondary | ICD-10-CM | POA: Diagnosis not present

## 2016-06-26 DIAGNOSIS — Z0000 Encounter for general adult medical examination without abnormal findings: Secondary | ICD-10-CM | POA: Diagnosis not present

## 2016-06-26 DIAGNOSIS — M899 Disorder of bone, unspecified: Secondary | ICD-10-CM | POA: Diagnosis not present

## 2016-06-26 DIAGNOSIS — N400 Enlarged prostate without lower urinary tract symptoms: Secondary | ICD-10-CM | POA: Diagnosis not present

## 2016-06-26 DIAGNOSIS — I480 Paroxysmal atrial fibrillation: Secondary | ICD-10-CM | POA: Diagnosis not present

## 2016-06-26 DIAGNOSIS — E7800 Pure hypercholesterolemia, unspecified: Secondary | ICD-10-CM | POA: Diagnosis not present

## 2016-06-26 DIAGNOSIS — E890 Postprocedural hypothyroidism: Secondary | ICD-10-CM | POA: Diagnosis not present

## 2016-06-26 DIAGNOSIS — Z8679 Personal history of other diseases of the circulatory system: Secondary | ICD-10-CM | POA: Diagnosis not present

## 2016-06-26 DIAGNOSIS — I2510 Atherosclerotic heart disease of native coronary artery without angina pectoris: Secondary | ICD-10-CM | POA: Diagnosis not present

## 2016-06-29 DIAGNOSIS — R7309 Other abnormal glucose: Secondary | ICD-10-CM | POA: Diagnosis not present

## 2016-07-07 DIAGNOSIS — R490 Dysphonia: Secondary | ICD-10-CM | POA: Diagnosis not present

## 2016-07-07 DIAGNOSIS — J383 Other diseases of vocal cords: Secondary | ICD-10-CM | POA: Diagnosis not present

## 2016-07-23 DIAGNOSIS — M25562 Pain in left knee: Secondary | ICD-10-CM | POA: Diagnosis not present

## 2016-07-23 DIAGNOSIS — R600 Localized edema: Secondary | ICD-10-CM | POA: Diagnosis not present

## 2016-07-23 DIAGNOSIS — M25462 Effusion, left knee: Secondary | ICD-10-CM | POA: Diagnosis not present

## 2016-07-30 DIAGNOSIS — R609 Edema, unspecified: Secondary | ICD-10-CM | POA: Diagnosis not present

## 2016-07-30 DIAGNOSIS — R600 Localized edema: Secondary | ICD-10-CM | POA: Diagnosis not present

## 2016-09-14 DIAGNOSIS — R05 Cough: Secondary | ICD-10-CM | POA: Diagnosis not present

## 2016-09-14 DIAGNOSIS — J40 Bronchitis, not specified as acute or chronic: Secondary | ICD-10-CM | POA: Diagnosis not present

## 2016-09-17 DIAGNOSIS — J9801 Acute bronchospasm: Secondary | ICD-10-CM | POA: Diagnosis not present

## 2016-09-17 DIAGNOSIS — J069 Acute upper respiratory infection, unspecified: Secondary | ICD-10-CM | POA: Diagnosis not present

## 2016-09-23 ENCOUNTER — Other Ambulatory Visit: Payer: Self-pay | Admitting: Unknown Physician Specialty

## 2016-09-23 ENCOUNTER — Ambulatory Visit (INDEPENDENT_AMBULATORY_CARE_PROVIDER_SITE_OTHER): Payer: Medicare HMO

## 2016-09-23 DIAGNOSIS — R059 Cough, unspecified: Secondary | ICD-10-CM

## 2016-09-23 DIAGNOSIS — J9811 Atelectasis: Secondary | ICD-10-CM | POA: Diagnosis not present

## 2016-09-23 DIAGNOSIS — R05 Cough: Secondary | ICD-10-CM

## 2016-09-24 DIAGNOSIS — J069 Acute upper respiratory infection, unspecified: Secondary | ICD-10-CM | POA: Diagnosis not present

## 2016-09-24 DIAGNOSIS — R0602 Shortness of breath: Secondary | ICD-10-CM | POA: Diagnosis not present

## 2016-09-24 DIAGNOSIS — J9801 Acute bronchospasm: Secondary | ICD-10-CM | POA: Diagnosis not present

## 2016-09-24 DIAGNOSIS — R5383 Other fatigue: Secondary | ICD-10-CM | POA: Diagnosis not present

## 2016-09-25 DIAGNOSIS — R0602 Shortness of breath: Secondary | ICD-10-CM | POA: Diagnosis not present

## 2016-09-25 DIAGNOSIS — J9811 Atelectasis: Secondary | ICD-10-CM | POA: Diagnosis not present

## 2016-09-28 DIAGNOSIS — R0602 Shortness of breath: Secondary | ICD-10-CM | POA: Diagnosis not present

## 2016-09-28 DIAGNOSIS — J069 Acute upper respiratory infection, unspecified: Secondary | ICD-10-CM | POA: Diagnosis not present

## 2016-09-28 DIAGNOSIS — J9801 Acute bronchospasm: Secondary | ICD-10-CM | POA: Diagnosis not present

## 2016-09-28 DIAGNOSIS — R5383 Other fatigue: Secondary | ICD-10-CM | POA: Diagnosis not present

## 2016-09-29 DIAGNOSIS — M79672 Pain in left foot: Secondary | ICD-10-CM | POA: Diagnosis not present

## 2016-09-29 DIAGNOSIS — M79673 Pain in unspecified foot: Secondary | ICD-10-CM | POA: Diagnosis not present

## 2016-10-03 DIAGNOSIS — S92315S Nondisplaced fracture of first metatarsal bone, left foot, sequela: Secondary | ICD-10-CM | POA: Diagnosis not present

## 2016-10-03 DIAGNOSIS — M79673 Pain in unspecified foot: Secondary | ICD-10-CM | POA: Diagnosis not present

## 2016-10-05 DIAGNOSIS — M19079 Primary osteoarthritis, unspecified ankle and foot: Secondary | ICD-10-CM | POA: Diagnosis not present

## 2016-10-12 DIAGNOSIS — M19079 Primary osteoarthritis, unspecified ankle and foot: Secondary | ICD-10-CM | POA: Diagnosis not present

## 2016-10-12 DIAGNOSIS — M79673 Pain in unspecified foot: Secondary | ICD-10-CM | POA: Diagnosis not present

## 2016-10-19 DIAGNOSIS — R69 Illness, unspecified: Secondary | ICD-10-CM | POA: Diagnosis not present

## 2016-10-20 DIAGNOSIS — K625 Hemorrhage of anus and rectum: Secondary | ICD-10-CM | POA: Diagnosis not present

## 2016-10-20 DIAGNOSIS — R1084 Generalized abdominal pain: Secondary | ICD-10-CM | POA: Diagnosis not present

## 2016-10-20 DIAGNOSIS — R0602 Shortness of breath: Secondary | ICD-10-CM | POA: Diagnosis not present

## 2016-10-20 DIAGNOSIS — R195 Other fecal abnormalities: Secondary | ICD-10-CM | POA: Diagnosis not present

## 2016-10-22 DIAGNOSIS — R197 Diarrhea, unspecified: Secondary | ICD-10-CM | POA: Diagnosis not present

## 2016-10-22 DIAGNOSIS — R0602 Shortness of breath: Secondary | ICD-10-CM | POA: Diagnosis not present

## 2016-10-22 DIAGNOSIS — J9801 Acute bronchospasm: Secondary | ICD-10-CM | POA: Diagnosis not present

## 2016-10-22 DIAGNOSIS — R5383 Other fatigue: Secondary | ICD-10-CM | POA: Diagnosis not present

## 2016-10-22 DIAGNOSIS — Z79899 Other long term (current) drug therapy: Secondary | ICD-10-CM | POA: Diagnosis not present

## 2016-10-22 DIAGNOSIS — J069 Acute upper respiratory infection, unspecified: Secondary | ICD-10-CM | POA: Diagnosis not present

## 2016-10-27 DIAGNOSIS — I480 Paroxysmal atrial fibrillation: Secondary | ICD-10-CM | POA: Diagnosis not present

## 2016-10-27 DIAGNOSIS — I499 Cardiac arrhythmia, unspecified: Secondary | ICD-10-CM | POA: Diagnosis not present

## 2016-10-28 DIAGNOSIS — E890 Postprocedural hypothyroidism: Secondary | ICD-10-CM | POA: Diagnosis not present

## 2016-10-28 DIAGNOSIS — J45909 Unspecified asthma, uncomplicated: Secondary | ICD-10-CM | POA: Diagnosis not present

## 2016-10-28 DIAGNOSIS — R05 Cough: Secondary | ICD-10-CM | POA: Diagnosis not present

## 2016-11-02 DIAGNOSIS — I499 Cardiac arrhythmia, unspecified: Secondary | ICD-10-CM | POA: Diagnosis not present

## 2016-11-06 DIAGNOSIS — Z0100 Encounter for examination of eyes and vision without abnormal findings: Secondary | ICD-10-CM | POA: Diagnosis not present

## 2016-11-18 DIAGNOSIS — Z79899 Other long term (current) drug therapy: Secondary | ICD-10-CM | POA: Diagnosis not present

## 2016-11-18 DIAGNOSIS — I480 Paroxysmal atrial fibrillation: Secondary | ICD-10-CM | POA: Diagnosis not present

## 2016-11-18 DIAGNOSIS — E785 Hyperlipidemia, unspecified: Secondary | ICD-10-CM | POA: Diagnosis not present

## 2016-11-18 DIAGNOSIS — E890 Postprocedural hypothyroidism: Secondary | ICD-10-CM | POA: Diagnosis not present

## 2016-11-24 DIAGNOSIS — E892 Postprocedural hypoparathyroidism: Secondary | ICD-10-CM | POA: Diagnosis not present

## 2016-11-24 DIAGNOSIS — C73 Malignant neoplasm of thyroid gland: Secondary | ICD-10-CM | POA: Diagnosis not present

## 2016-11-24 DIAGNOSIS — E890 Postprocedural hypothyroidism: Secondary | ICD-10-CM | POA: Diagnosis not present

## 2017-01-13 DIAGNOSIS — D649 Anemia, unspecified: Secondary | ICD-10-CM | POA: Diagnosis not present

## 2017-01-13 DIAGNOSIS — K921 Melena: Secondary | ICD-10-CM | POA: Diagnosis not present

## 2017-01-13 DIAGNOSIS — R1084 Generalized abdominal pain: Secondary | ICD-10-CM | POA: Diagnosis not present

## 2017-01-13 DIAGNOSIS — R197 Diarrhea, unspecified: Secondary | ICD-10-CM | POA: Diagnosis not present

## 2017-01-22 DIAGNOSIS — Z86010 Personal history of colonic polyps: Secondary | ICD-10-CM | POA: Diagnosis not present

## 2017-01-22 DIAGNOSIS — D125 Benign neoplasm of sigmoid colon: Secondary | ICD-10-CM | POA: Diagnosis not present

## 2017-01-22 DIAGNOSIS — K621 Rectal polyp: Secondary | ICD-10-CM | POA: Diagnosis not present

## 2017-01-27 DIAGNOSIS — E890 Postprocedural hypothyroidism: Secondary | ICD-10-CM | POA: Diagnosis not present

## 2017-01-27 DIAGNOSIS — E7800 Pure hypercholesterolemia, unspecified: Secondary | ICD-10-CM | POA: Diagnosis not present

## 2017-01-27 DIAGNOSIS — R739 Hyperglycemia, unspecified: Secondary | ICD-10-CM | POA: Diagnosis not present

## 2017-01-27 DIAGNOSIS — I480 Paroxysmal atrial fibrillation: Secondary | ICD-10-CM | POA: Diagnosis not present

## 2017-01-27 DIAGNOSIS — D649 Anemia, unspecified: Secondary | ICD-10-CM | POA: Diagnosis not present

## 2017-02-10 DIAGNOSIS — R197 Diarrhea, unspecified: Secondary | ICD-10-CM | POA: Diagnosis not present

## 2017-02-10 DIAGNOSIS — K921 Melena: Secondary | ICD-10-CM | POA: Diagnosis not present

## 2017-02-10 DIAGNOSIS — D509 Iron deficiency anemia, unspecified: Secondary | ICD-10-CM | POA: Diagnosis not present

## 2017-02-10 DIAGNOSIS — R109 Unspecified abdominal pain: Secondary | ICD-10-CM | POA: Diagnosis not present

## 2017-02-23 DIAGNOSIS — Z08 Encounter for follow-up examination after completed treatment for malignant neoplasm: Secondary | ICD-10-CM | POA: Diagnosis not present

## 2017-02-23 DIAGNOSIS — D485 Neoplasm of uncertain behavior of skin: Secondary | ICD-10-CM | POA: Diagnosis not present

## 2017-02-23 DIAGNOSIS — C44319 Basal cell carcinoma of skin of other parts of face: Secondary | ICD-10-CM | POA: Diagnosis not present

## 2017-02-23 DIAGNOSIS — Z85828 Personal history of other malignant neoplasm of skin: Secondary | ICD-10-CM | POA: Diagnosis not present

## 2017-03-10 DIAGNOSIS — K641 Second degree hemorrhoids: Secondary | ICD-10-CM | POA: Diagnosis not present

## 2017-05-10 DIAGNOSIS — C44319 Basal cell carcinoma of skin of other parts of face: Secondary | ICD-10-CM | POA: Diagnosis not present

## 2017-05-14 DIAGNOSIS — K641 Second degree hemorrhoids: Secondary | ICD-10-CM | POA: Diagnosis not present

## 2017-06-07 DIAGNOSIS — K641 Second degree hemorrhoids: Secondary | ICD-10-CM | POA: Diagnosis not present

## 2017-06-08 DIAGNOSIS — D485 Neoplasm of uncertain behavior of skin: Secondary | ICD-10-CM | POA: Diagnosis not present

## 2017-06-08 DIAGNOSIS — L820 Inflamed seborrheic keratosis: Secondary | ICD-10-CM | POA: Diagnosis not present

## 2017-06-08 DIAGNOSIS — R0609 Other forms of dyspnea: Secondary | ICD-10-CM | POA: Diagnosis not present

## 2017-06-15 DIAGNOSIS — Z23 Encounter for immunization: Secondary | ICD-10-CM | POA: Diagnosis not present

## 2017-07-09 DIAGNOSIS — E785 Hyperlipidemia, unspecified: Secondary | ICD-10-CM | POA: Diagnosis not present

## 2017-07-09 DIAGNOSIS — C73 Malignant neoplasm of thyroid gland: Secondary | ICD-10-CM | POA: Diagnosis not present

## 2017-07-09 DIAGNOSIS — E039 Hypothyroidism, unspecified: Secondary | ICD-10-CM | POA: Diagnosis not present

## 2017-07-09 DIAGNOSIS — R0609 Other forms of dyspnea: Secondary | ICD-10-CM | POA: Diagnosis not present

## 2017-07-09 DIAGNOSIS — R931 Abnormal findings on diagnostic imaging of heart and coronary circulation: Secondary | ICD-10-CM | POA: Diagnosis not present

## 2017-07-09 DIAGNOSIS — I480 Paroxysmal atrial fibrillation: Secondary | ICD-10-CM | POA: Diagnosis not present

## 2017-07-14 DIAGNOSIS — N400 Enlarged prostate without lower urinary tract symptoms: Secondary | ICD-10-CM | POA: Diagnosis not present

## 2017-07-14 DIAGNOSIS — G479 Sleep disorder, unspecified: Secondary | ICD-10-CM | POA: Diagnosis not present

## 2017-07-14 DIAGNOSIS — I480 Paroxysmal atrial fibrillation: Secondary | ICD-10-CM | POA: Diagnosis not present

## 2017-07-14 DIAGNOSIS — Z0000 Encounter for general adult medical examination without abnormal findings: Secondary | ICD-10-CM | POA: Diagnosis not present

## 2017-07-14 DIAGNOSIS — E7800 Pure hypercholesterolemia, unspecified: Secondary | ICD-10-CM | POA: Diagnosis not present

## 2017-07-14 DIAGNOSIS — D649 Anemia, unspecified: Secondary | ICD-10-CM | POA: Diagnosis not present

## 2017-07-14 DIAGNOSIS — Z125 Encounter for screening for malignant neoplasm of prostate: Secondary | ICD-10-CM | POA: Diagnosis not present

## 2017-07-28 DIAGNOSIS — J9801 Acute bronchospasm: Secondary | ICD-10-CM | POA: Diagnosis not present

## 2017-07-28 DIAGNOSIS — J069 Acute upper respiratory infection, unspecified: Secondary | ICD-10-CM | POA: Diagnosis not present

## 2017-09-23 DIAGNOSIS — J9801 Acute bronchospasm: Secondary | ICD-10-CM | POA: Diagnosis not present

## 2017-09-23 DIAGNOSIS — J069 Acute upper respiratory infection, unspecified: Secondary | ICD-10-CM | POA: Diagnosis not present

## 2017-10-01 DIAGNOSIS — R0602 Shortness of breath: Secondary | ICD-10-CM | POA: Diagnosis not present

## 2017-10-01 DIAGNOSIS — J849 Interstitial pulmonary disease, unspecified: Secondary | ICD-10-CM | POA: Diagnosis not present

## 2017-10-01 DIAGNOSIS — J988 Other specified respiratory disorders: Secondary | ICD-10-CM | POA: Diagnosis not present

## 2017-10-01 DIAGNOSIS — R062 Wheezing: Secondary | ICD-10-CM | POA: Diagnosis not present

## 2017-10-08 DIAGNOSIS — R918 Other nonspecific abnormal finding of lung field: Secondary | ICD-10-CM | POA: Diagnosis not present

## 2017-10-08 DIAGNOSIS — J9811 Atelectasis: Secondary | ICD-10-CM | POA: Diagnosis not present

## 2017-10-08 DIAGNOSIS — J849 Interstitial pulmonary disease, unspecified: Secondary | ICD-10-CM | POA: Diagnosis not present

## 2017-10-14 DIAGNOSIS — J4541 Moderate persistent asthma with (acute) exacerbation: Secondary | ICD-10-CM | POA: Diagnosis not present

## 2017-10-14 DIAGNOSIS — R05 Cough: Secondary | ICD-10-CM | POA: Diagnosis not present

## 2017-10-22 DIAGNOSIS — J209 Acute bronchitis, unspecified: Secondary | ICD-10-CM | POA: Diagnosis not present

## 2017-10-22 DIAGNOSIS — J849 Interstitial pulmonary disease, unspecified: Secondary | ICD-10-CM | POA: Diagnosis not present

## 2017-10-22 DIAGNOSIS — J4541 Moderate persistent asthma with (acute) exacerbation: Secondary | ICD-10-CM | POA: Diagnosis not present

## 2017-10-25 DIAGNOSIS — J209 Acute bronchitis, unspecified: Secondary | ICD-10-CM | POA: Diagnosis not present

## 2017-10-25 DIAGNOSIS — J4541 Moderate persistent asthma with (acute) exacerbation: Secondary | ICD-10-CM | POA: Diagnosis not present

## 2017-11-24 DIAGNOSIS — Z7982 Long term (current) use of aspirin: Secondary | ICD-10-CM | POA: Diagnosis not present

## 2017-11-24 DIAGNOSIS — I480 Paroxysmal atrial fibrillation: Secondary | ICD-10-CM | POA: Diagnosis not present

## 2017-11-24 DIAGNOSIS — Z9889 Other specified postprocedural states: Secondary | ICD-10-CM | POA: Diagnosis not present

## 2017-11-25 DIAGNOSIS — E892 Postprocedural hypoparathyroidism: Secondary | ICD-10-CM | POA: Diagnosis not present

## 2017-11-25 DIAGNOSIS — C73 Malignant neoplasm of thyroid gland: Secondary | ICD-10-CM | POA: Diagnosis not present

## 2017-11-25 DIAGNOSIS — E890 Postprocedural hypothyroidism: Secondary | ICD-10-CM | POA: Diagnosis not present

## 2017-11-30 DIAGNOSIS — I480 Paroxysmal atrial fibrillation: Secondary | ICD-10-CM | POA: Diagnosis not present

## 2017-11-30 DIAGNOSIS — I499 Cardiac arrhythmia, unspecified: Secondary | ICD-10-CM | POA: Diagnosis not present

## 2018-01-04 DIAGNOSIS — G479 Sleep disorder, unspecified: Secondary | ICD-10-CM | POA: Diagnosis not present

## 2018-01-04 DIAGNOSIS — I480 Paroxysmal atrial fibrillation: Secondary | ICD-10-CM | POA: Diagnosis not present

## 2018-01-04 DIAGNOSIS — E7800 Pure hypercholesterolemia, unspecified: Secondary | ICD-10-CM | POA: Diagnosis not present

## 2018-01-04 DIAGNOSIS — R972 Elevated prostate specific antigen [PSA]: Secondary | ICD-10-CM | POA: Diagnosis not present

## 2018-01-05 DIAGNOSIS — Z113 Encounter for screening for infections with a predominantly sexual mode of transmission: Secondary | ICD-10-CM | POA: Diagnosis not present

## 2018-01-05 DIAGNOSIS — R5383 Other fatigue: Secondary | ICD-10-CM | POA: Diagnosis not present

## 2018-01-11 DIAGNOSIS — I517 Cardiomegaly: Secondary | ICD-10-CM | POA: Diagnosis not present

## 2018-01-11 DIAGNOSIS — E785 Hyperlipidemia, unspecified: Secondary | ICD-10-CM | POA: Diagnosis not present

## 2018-01-11 DIAGNOSIS — Z85828 Personal history of other malignant neoplasm of skin: Secondary | ICD-10-CM | POA: Diagnosis not present

## 2018-01-11 DIAGNOSIS — N200 Calculus of kidney: Secondary | ICD-10-CM | POA: Diagnosis not present

## 2018-01-11 DIAGNOSIS — K7689 Other specified diseases of liver: Secondary | ICD-10-CM | POA: Diagnosis not present

## 2018-01-11 DIAGNOSIS — R109 Unspecified abdominal pain: Secondary | ICD-10-CM | POA: Diagnosis not present

## 2018-01-11 DIAGNOSIS — R197 Diarrhea, unspecified: Secondary | ICD-10-CM | POA: Diagnosis not present

## 2018-01-11 DIAGNOSIS — R1084 Generalized abdominal pain: Secondary | ICD-10-CM | POA: Diagnosis not present

## 2018-01-11 DIAGNOSIS — N281 Cyst of kidney, acquired: Secondary | ICD-10-CM | POA: Diagnosis not present

## 2018-01-11 DIAGNOSIS — Z85850 Personal history of malignant neoplasm of thyroid: Secondary | ICD-10-CM | POA: Diagnosis not present

## 2018-01-25 DIAGNOSIS — R1013 Epigastric pain: Secondary | ICD-10-CM | POA: Diagnosis not present

## 2018-01-25 DIAGNOSIS — K589 Irritable bowel syndrome without diarrhea: Secondary | ICD-10-CM | POA: Diagnosis not present

## 2018-01-25 DIAGNOSIS — F439 Reaction to severe stress, unspecified: Secondary | ICD-10-CM | POA: Diagnosis not present

## 2018-01-25 DIAGNOSIS — R030 Elevated blood-pressure reading, without diagnosis of hypertension: Secondary | ICD-10-CM | POA: Diagnosis not present

## 2018-02-03 DIAGNOSIS — R350 Frequency of micturition: Secondary | ICD-10-CM | POA: Diagnosis not present

## 2018-02-03 DIAGNOSIS — Z8042 Family history of malignant neoplasm of prostate: Secondary | ICD-10-CM | POA: Diagnosis not present

## 2018-02-03 DIAGNOSIS — R972 Elevated prostate specific antigen [PSA]: Secondary | ICD-10-CM | POA: Diagnosis not present

## 2018-03-10 DIAGNOSIS — R350 Frequency of micturition: Secondary | ICD-10-CM | POA: Diagnosis not present

## 2018-04-07 DIAGNOSIS — M62838 Other muscle spasm: Secondary | ICD-10-CM | POA: Diagnosis not present

## 2018-04-07 DIAGNOSIS — R2240 Localized swelling, mass and lump, unspecified lower limb: Secondary | ICD-10-CM | POA: Diagnosis not present

## 2018-04-07 DIAGNOSIS — G479 Sleep disorder, unspecified: Secondary | ICD-10-CM | POA: Diagnosis not present

## 2018-04-07 DIAGNOSIS — S134XXA Sprain of ligaments of cervical spine, initial encounter: Secondary | ICD-10-CM | POA: Diagnosis not present

## 2018-05-06 ENCOUNTER — Other Ambulatory Visit: Payer: Self-pay | Admitting: Unknown Physician Specialty

## 2018-05-06 ENCOUNTER — Ambulatory Visit (INDEPENDENT_AMBULATORY_CARE_PROVIDER_SITE_OTHER): Payer: Medicare HMO

## 2018-05-06 DIAGNOSIS — M542 Cervicalgia: Secondary | ICD-10-CM

## 2018-05-06 DIAGNOSIS — M5033 Other cervical disc degeneration, cervicothoracic region: Secondary | ICD-10-CM | POA: Diagnosis not present

## 2018-05-17 DIAGNOSIS — R1900 Intra-abdominal and pelvic swelling, mass and lump, unspecified site: Secondary | ICD-10-CM | POA: Diagnosis not present

## 2018-05-17 DIAGNOSIS — R1084 Generalized abdominal pain: Secondary | ICD-10-CM | POA: Diagnosis not present

## 2018-06-02 DIAGNOSIS — R972 Elevated prostate specific antigen [PSA]: Secondary | ICD-10-CM | POA: Diagnosis not present

## 2018-06-02 DIAGNOSIS — C61 Malignant neoplasm of prostate: Secondary | ICD-10-CM | POA: Diagnosis not present

## 2018-06-15 DIAGNOSIS — J069 Acute upper respiratory infection, unspecified: Secondary | ICD-10-CM | POA: Diagnosis not present

## 2018-06-15 DIAGNOSIS — J9801 Acute bronchospasm: Secondary | ICD-10-CM | POA: Diagnosis not present

## 2018-06-15 DIAGNOSIS — G479 Sleep disorder, unspecified: Secondary | ICD-10-CM | POA: Diagnosis not present

## 2018-07-19 DIAGNOSIS — C61 Malignant neoplasm of prostate: Secondary | ICD-10-CM | POA: Diagnosis not present

## 2018-07-20 DIAGNOSIS — G479 Sleep disorder, unspecified: Secondary | ICD-10-CM | POA: Diagnosis not present

## 2018-07-20 DIAGNOSIS — N529 Male erectile dysfunction, unspecified: Secondary | ICD-10-CM | POA: Diagnosis not present

## 2018-07-20 DIAGNOSIS — R5383 Other fatigue: Secondary | ICD-10-CM | POA: Diagnosis not present

## 2018-07-20 DIAGNOSIS — Z23 Encounter for immunization: Secondary | ICD-10-CM | POA: Diagnosis not present

## 2018-07-20 DIAGNOSIS — D649 Anemia, unspecified: Secondary | ICD-10-CM | POA: Diagnosis not present

## 2018-07-20 DIAGNOSIS — J45909 Unspecified asthma, uncomplicated: Secondary | ICD-10-CM | POA: Diagnosis not present

## 2018-07-20 DIAGNOSIS — E7800 Pure hypercholesterolemia, unspecified: Secondary | ICD-10-CM | POA: Diagnosis not present

## 2018-07-20 DIAGNOSIS — Z0000 Encounter for general adult medical examination without abnormal findings: Secondary | ICD-10-CM | POA: Diagnosis not present

## 2018-08-15 DIAGNOSIS — J069 Acute upper respiratory infection, unspecified: Secondary | ICD-10-CM | POA: Diagnosis not present

## 2018-08-15 DIAGNOSIS — J45909 Unspecified asthma, uncomplicated: Secondary | ICD-10-CM | POA: Diagnosis not present

## 2018-08-15 DIAGNOSIS — J9801 Acute bronchospasm: Secondary | ICD-10-CM | POA: Diagnosis not present

## 2018-08-16 ENCOUNTER — Other Ambulatory Visit: Payer: Self-pay | Admitting: Unknown Physician Specialty

## 2018-08-16 DIAGNOSIS — R062 Wheezing: Secondary | ICD-10-CM

## 2018-08-17 ENCOUNTER — Ambulatory Visit (INDEPENDENT_AMBULATORY_CARE_PROVIDER_SITE_OTHER): Payer: Medicare HMO

## 2018-08-17 DIAGNOSIS — R062 Wheezing: Secondary | ICD-10-CM | POA: Diagnosis not present

## 2018-08-17 DIAGNOSIS — R0602 Shortness of breath: Secondary | ICD-10-CM | POA: Diagnosis not present

## 2018-08-19 DIAGNOSIS — N401 Enlarged prostate with lower urinary tract symptoms: Secondary | ICD-10-CM | POA: Diagnosis not present

## 2018-08-19 DIAGNOSIS — R972 Elevated prostate specific antigen [PSA]: Secondary | ICD-10-CM | POA: Diagnosis not present

## 2018-08-19 DIAGNOSIS — C61 Malignant neoplasm of prostate: Secondary | ICD-10-CM | POA: Diagnosis not present

## 2018-09-08 DIAGNOSIS — C61 Malignant neoplasm of prostate: Secondary | ICD-10-CM | POA: Diagnosis not present

## 2018-09-22 DIAGNOSIS — C61 Malignant neoplasm of prostate: Secondary | ICD-10-CM | POA: Diagnosis not present

## 2018-09-23 DIAGNOSIS — Z01812 Encounter for preprocedural laboratory examination: Secondary | ICD-10-CM | POA: Diagnosis not present

## 2018-09-23 DIAGNOSIS — C61 Malignant neoplasm of prostate: Secondary | ICD-10-CM | POA: Diagnosis not present

## 2018-09-28 DIAGNOSIS — R9349 Abnormal radiologic findings on diagnostic imaging of other urinary organs: Secondary | ICD-10-CM | POA: Diagnosis not present

## 2018-09-28 DIAGNOSIS — C61 Malignant neoplasm of prostate: Secondary | ICD-10-CM | POA: Diagnosis not present

## 2018-09-28 DIAGNOSIS — R361 Hematospermia: Secondary | ICD-10-CM | POA: Diagnosis not present

## 2018-09-28 DIAGNOSIS — Z85850 Personal history of malignant neoplasm of thyroid: Secondary | ICD-10-CM | POA: Diagnosis not present

## 2018-09-28 DIAGNOSIS — N529 Male erectile dysfunction, unspecified: Secondary | ICD-10-CM | POA: Diagnosis not present

## 2018-09-28 DIAGNOSIS — R3912 Poor urinary stream: Secondary | ICD-10-CM | POA: Diagnosis not present

## 2018-10-14 DIAGNOSIS — K7689 Other specified diseases of liver: Secondary | ICD-10-CM | POA: Diagnosis not present

## 2018-10-14 DIAGNOSIS — N200 Calculus of kidney: Secondary | ICD-10-CM | POA: Diagnosis not present

## 2018-10-14 DIAGNOSIS — C61 Malignant neoplasm of prostate: Secondary | ICD-10-CM | POA: Diagnosis not present

## 2018-10-31 DIAGNOSIS — R972 Elevated prostate specific antigen [PSA]: Secondary | ICD-10-CM | POA: Diagnosis not present

## 2018-10-31 DIAGNOSIS — C61 Malignant neoplasm of prostate: Secondary | ICD-10-CM | POA: Diagnosis not present

## 2018-11-22 DIAGNOSIS — I480 Paroxysmal atrial fibrillation: Secondary | ICD-10-CM | POA: Diagnosis not present

## 2018-11-22 DIAGNOSIS — I499 Cardiac arrhythmia, unspecified: Secondary | ICD-10-CM | POA: Diagnosis not present

## 2018-11-24 DIAGNOSIS — I480 Paroxysmal atrial fibrillation: Secondary | ICD-10-CM | POA: Diagnosis not present

## 2018-11-24 DIAGNOSIS — I471 Supraventricular tachycardia: Secondary | ICD-10-CM | POA: Diagnosis not present

## 2018-11-28 DIAGNOSIS — C73 Malignant neoplasm of thyroid gland: Secondary | ICD-10-CM | POA: Diagnosis not present

## 2018-11-28 DIAGNOSIS — E892 Postprocedural hypoparathyroidism: Secondary | ICD-10-CM | POA: Diagnosis not present

## 2018-11-28 DIAGNOSIS — E890 Postprocedural hypothyroidism: Secondary | ICD-10-CM | POA: Diagnosis not present

## 2018-12-26 DIAGNOSIS — Z79818 Long term (current) use of other agents affecting estrogen receptors and estrogen levels: Secondary | ICD-10-CM | POA: Diagnosis not present

## 2018-12-26 DIAGNOSIS — R9721 Rising PSA following treatment for malignant neoplasm of prostate: Secondary | ICD-10-CM | POA: Diagnosis not present

## 2018-12-26 DIAGNOSIS — M25561 Pain in right knee: Secondary | ICD-10-CM | POA: Diagnosis not present

## 2018-12-26 DIAGNOSIS — C61 Malignant neoplasm of prostate: Secondary | ICD-10-CM | POA: Diagnosis not present

## 2019-01-09 DIAGNOSIS — M25461 Effusion, right knee: Secondary | ICD-10-CM | POA: Diagnosis not present

## 2019-01-09 DIAGNOSIS — M25561 Pain in right knee: Secondary | ICD-10-CM | POA: Diagnosis not present

## 2019-01-09 DIAGNOSIS — S83241A Other tear of medial meniscus, current injury, right knee, initial encounter: Secondary | ICD-10-CM | POA: Diagnosis not present

## 2019-01-16 DIAGNOSIS — M25561 Pain in right knee: Secondary | ICD-10-CM | POA: Diagnosis not present

## 2019-01-19 DIAGNOSIS — E7800 Pure hypercholesterolemia, unspecified: Secondary | ICD-10-CM | POA: Diagnosis not present

## 2019-01-19 DIAGNOSIS — N529 Male erectile dysfunction, unspecified: Secondary | ICD-10-CM | POA: Diagnosis not present

## 2019-01-19 DIAGNOSIS — G479 Sleep disorder, unspecified: Secondary | ICD-10-CM | POA: Diagnosis not present

## 2019-01-19 DIAGNOSIS — J45909 Unspecified asthma, uncomplicated: Secondary | ICD-10-CM | POA: Diagnosis not present

## 2019-01-23 DIAGNOSIS — R110 Nausea: Secondary | ICD-10-CM | POA: Diagnosis not present

## 2019-01-23 DIAGNOSIS — R0602 Shortness of breath: Secondary | ICD-10-CM | POA: Diagnosis not present

## 2019-01-23 DIAGNOSIS — R5383 Other fatigue: Secondary | ICD-10-CM | POA: Diagnosis not present

## 2019-01-23 DIAGNOSIS — Z20828 Contact with and (suspected) exposure to other viral communicable diseases: Secondary | ICD-10-CM | POA: Diagnosis not present

## 2019-01-23 DIAGNOSIS — R197 Diarrhea, unspecified: Secondary | ICD-10-CM | POA: Diagnosis not present

## 2019-01-24 DIAGNOSIS — R9431 Abnormal electrocardiogram [ECG] [EKG]: Secondary | ICD-10-CM | POA: Diagnosis not present

## 2019-01-24 DIAGNOSIS — I480 Paroxysmal atrial fibrillation: Secondary | ICD-10-CM | POA: Diagnosis not present

## 2019-02-10 DIAGNOSIS — M25561 Pain in right knee: Secondary | ICD-10-CM | POA: Diagnosis not present

## 2019-02-20 DIAGNOSIS — Z510 Encounter for antineoplastic radiation therapy: Secondary | ICD-10-CM | POA: Diagnosis not present

## 2019-02-20 DIAGNOSIS — R972 Elevated prostate specific antigen [PSA]: Secondary | ICD-10-CM | POA: Diagnosis not present

## 2019-02-20 DIAGNOSIS — C61 Malignant neoplasm of prostate: Secondary | ICD-10-CM | POA: Diagnosis not present

## 2019-02-22 DIAGNOSIS — C61 Malignant neoplasm of prostate: Secondary | ICD-10-CM | POA: Diagnosis not present

## 2019-02-23 DIAGNOSIS — C61 Malignant neoplasm of prostate: Secondary | ICD-10-CM | POA: Diagnosis not present

## 2019-03-01 DIAGNOSIS — I10 Essential (primary) hypertension: Secondary | ICD-10-CM | POA: Diagnosis not present

## 2019-03-01 DIAGNOSIS — C61 Malignant neoplasm of prostate: Secondary | ICD-10-CM | POA: Diagnosis not present

## 2019-03-01 DIAGNOSIS — Z9889 Other specified postprocedural states: Secondary | ICD-10-CM | POA: Diagnosis not present

## 2019-03-01 DIAGNOSIS — R232 Flushing: Secondary | ICD-10-CM | POA: Diagnosis not present

## 2019-03-01 DIAGNOSIS — I480 Paroxysmal atrial fibrillation: Secondary | ICD-10-CM | POA: Diagnosis not present

## 2019-03-02 DIAGNOSIS — Z510 Encounter for antineoplastic radiation therapy: Secondary | ICD-10-CM | POA: Diagnosis not present

## 2019-03-02 DIAGNOSIS — C61 Malignant neoplasm of prostate: Secondary | ICD-10-CM | POA: Diagnosis not present

## 2019-03-04 DIAGNOSIS — Z1159 Encounter for screening for other viral diseases: Secondary | ICD-10-CM | POA: Diagnosis not present

## 2019-03-04 DIAGNOSIS — Z01812 Encounter for preprocedural laboratory examination: Secondary | ICD-10-CM | POA: Diagnosis not present

## 2019-03-05 DIAGNOSIS — I10 Essential (primary) hypertension: Secondary | ICD-10-CM | POA: Diagnosis not present

## 2019-03-05 DIAGNOSIS — I480 Paroxysmal atrial fibrillation: Secondary | ICD-10-CM | POA: Diagnosis not present

## 2019-03-05 DIAGNOSIS — E7800 Pure hypercholesterolemia, unspecified: Secondary | ICD-10-CM | POA: Diagnosis not present

## 2019-03-05 DIAGNOSIS — C61 Malignant neoplasm of prostate: Secondary | ICD-10-CM | POA: Diagnosis not present

## 2019-03-05 DIAGNOSIS — E892 Postprocedural hypoparathyroidism: Secondary | ICD-10-CM | POA: Diagnosis not present

## 2019-03-05 DIAGNOSIS — Z79899 Other long term (current) drug therapy: Secondary | ICD-10-CM | POA: Diagnosis not present

## 2019-03-05 DIAGNOSIS — R072 Precordial pain: Secondary | ICD-10-CM | POA: Diagnosis not present

## 2019-03-05 DIAGNOSIS — Z7951 Long term (current) use of inhaled steroids: Secondary | ICD-10-CM | POA: Diagnosis not present

## 2019-03-05 DIAGNOSIS — R0602 Shortness of breath: Secondary | ICD-10-CM | POA: Diagnosis not present

## 2019-03-05 DIAGNOSIS — Z885 Allergy status to narcotic agent status: Secondary | ICD-10-CM | POA: Diagnosis not present

## 2019-03-05 DIAGNOSIS — R079 Chest pain, unspecified: Secondary | ICD-10-CM | POA: Diagnosis not present

## 2019-03-05 DIAGNOSIS — Z7982 Long term (current) use of aspirin: Secondary | ICD-10-CM | POA: Diagnosis not present

## 2019-03-05 DIAGNOSIS — I160 Hypertensive urgency: Secondary | ICD-10-CM | POA: Diagnosis not present

## 2019-03-05 DIAGNOSIS — E890 Postprocedural hypothyroidism: Secondary | ICD-10-CM | POA: Diagnosis not present

## 2019-03-06 DIAGNOSIS — G8929 Other chronic pain: Secondary | ICD-10-CM | POA: Diagnosis not present

## 2019-03-06 DIAGNOSIS — E890 Postprocedural hypothyroidism: Secondary | ICD-10-CM | POA: Diagnosis not present

## 2019-03-06 DIAGNOSIS — Z885 Allergy status to narcotic agent status: Secondary | ICD-10-CM | POA: Diagnosis not present

## 2019-03-06 DIAGNOSIS — I160 Hypertensive urgency: Secondary | ICD-10-CM | POA: Diagnosis not present

## 2019-03-06 DIAGNOSIS — Z79899 Other long term (current) drug therapy: Secondary | ICD-10-CM | POA: Diagnosis not present

## 2019-03-06 DIAGNOSIS — E7800 Pure hypercholesterolemia, unspecified: Secondary | ICD-10-CM | POA: Diagnosis not present

## 2019-03-06 DIAGNOSIS — M25561 Pain in right knee: Secondary | ICD-10-CM | POA: Diagnosis not present

## 2019-03-06 DIAGNOSIS — I480 Paroxysmal atrial fibrillation: Secondary | ICD-10-CM | POA: Diagnosis not present

## 2019-03-06 DIAGNOSIS — C61 Malignant neoplasm of prostate: Secondary | ICD-10-CM | POA: Diagnosis not present

## 2019-03-06 DIAGNOSIS — Z7951 Long term (current) use of inhaled steroids: Secondary | ICD-10-CM | POA: Diagnosis not present

## 2019-03-06 DIAGNOSIS — E892 Postprocedural hypoparathyroidism: Secondary | ICD-10-CM | POA: Diagnosis not present

## 2019-03-06 DIAGNOSIS — Z7982 Long term (current) use of aspirin: Secondary | ICD-10-CM | POA: Diagnosis not present

## 2019-03-09 DIAGNOSIS — C61 Malignant neoplasm of prostate: Secondary | ICD-10-CM | POA: Diagnosis not present

## 2019-03-09 DIAGNOSIS — Z510 Encounter for antineoplastic radiation therapy: Secondary | ICD-10-CM | POA: Diagnosis not present

## 2019-03-10 DIAGNOSIS — C61 Malignant neoplasm of prostate: Secondary | ICD-10-CM | POA: Diagnosis not present

## 2019-03-10 DIAGNOSIS — Z510 Encounter for antineoplastic radiation therapy: Secondary | ICD-10-CM | POA: Diagnosis not present

## 2019-03-13 DIAGNOSIS — I480 Paroxysmal atrial fibrillation: Secondary | ICD-10-CM | POA: Diagnosis not present

## 2019-03-13 DIAGNOSIS — I10 Essential (primary) hypertension: Secondary | ICD-10-CM | POA: Diagnosis not present

## 2019-03-13 DIAGNOSIS — C61 Malignant neoplasm of prostate: Secondary | ICD-10-CM | POA: Diagnosis not present

## 2019-03-13 DIAGNOSIS — Z510 Encounter for antineoplastic radiation therapy: Secondary | ICD-10-CM | POA: Diagnosis not present

## 2019-03-13 DIAGNOSIS — E890 Postprocedural hypothyroidism: Secondary | ICD-10-CM | POA: Diagnosis not present

## 2019-03-13 DIAGNOSIS — Z79899 Other long term (current) drug therapy: Secondary | ICD-10-CM | POA: Diagnosis not present

## 2019-03-13 DIAGNOSIS — Z85850 Personal history of malignant neoplasm of thyroid: Secondary | ICD-10-CM | POA: Diagnosis not present

## 2019-03-13 DIAGNOSIS — E892 Postprocedural hypoparathyroidism: Secondary | ICD-10-CM | POA: Diagnosis not present

## 2019-03-13 DIAGNOSIS — Z87442 Personal history of urinary calculi: Secondary | ICD-10-CM | POA: Diagnosis not present

## 2019-03-13 DIAGNOSIS — C73 Malignant neoplasm of thyroid gland: Secondary | ICD-10-CM | POA: Diagnosis not present

## 2019-03-14 DIAGNOSIS — Z510 Encounter for antineoplastic radiation therapy: Secondary | ICD-10-CM | POA: Diagnosis not present

## 2019-03-14 DIAGNOSIS — C61 Malignant neoplasm of prostate: Secondary | ICD-10-CM | POA: Diagnosis not present

## 2019-03-15 DIAGNOSIS — C61 Malignant neoplasm of prostate: Secondary | ICD-10-CM | POA: Diagnosis not present

## 2019-03-15 DIAGNOSIS — Z510 Encounter for antineoplastic radiation therapy: Secondary | ICD-10-CM | POA: Diagnosis not present

## 2019-03-16 DIAGNOSIS — Z510 Encounter for antineoplastic radiation therapy: Secondary | ICD-10-CM | POA: Diagnosis not present

## 2019-03-16 DIAGNOSIS — C61 Malignant neoplasm of prostate: Secondary | ICD-10-CM | POA: Diagnosis not present

## 2019-03-20 DIAGNOSIS — C61 Malignant neoplasm of prostate: Secondary | ICD-10-CM | POA: Diagnosis not present

## 2019-03-20 DIAGNOSIS — Z510 Encounter for antineoplastic radiation therapy: Secondary | ICD-10-CM | POA: Diagnosis not present

## 2019-03-21 DIAGNOSIS — C61 Malignant neoplasm of prostate: Secondary | ICD-10-CM | POA: Diagnosis not present

## 2019-03-21 DIAGNOSIS — Z510 Encounter for antineoplastic radiation therapy: Secondary | ICD-10-CM | POA: Diagnosis not present

## 2019-03-22 DIAGNOSIS — Z510 Encounter for antineoplastic radiation therapy: Secondary | ICD-10-CM | POA: Diagnosis not present

## 2019-03-22 DIAGNOSIS — C61 Malignant neoplasm of prostate: Secondary | ICD-10-CM | POA: Diagnosis not present

## 2019-03-23 DIAGNOSIS — Z510 Encounter for antineoplastic radiation therapy: Secondary | ICD-10-CM | POA: Diagnosis not present

## 2019-03-23 DIAGNOSIS — C61 Malignant neoplasm of prostate: Secondary | ICD-10-CM | POA: Diagnosis not present

## 2019-03-24 DIAGNOSIS — C61 Malignant neoplasm of prostate: Secondary | ICD-10-CM | POA: Diagnosis not present

## 2019-03-24 DIAGNOSIS — Z510 Encounter for antineoplastic radiation therapy: Secondary | ICD-10-CM | POA: Diagnosis not present

## 2019-03-27 DIAGNOSIS — Z510 Encounter for antineoplastic radiation therapy: Secondary | ICD-10-CM | POA: Diagnosis not present

## 2019-03-27 DIAGNOSIS — C61 Malignant neoplasm of prostate: Secondary | ICD-10-CM | POA: Diagnosis not present

## 2019-03-28 DIAGNOSIS — Z510 Encounter for antineoplastic radiation therapy: Secondary | ICD-10-CM | POA: Diagnosis not present

## 2019-03-28 DIAGNOSIS — C61 Malignant neoplasm of prostate: Secondary | ICD-10-CM | POA: Diagnosis not present

## 2019-03-29 DIAGNOSIS — Z510 Encounter for antineoplastic radiation therapy: Secondary | ICD-10-CM | POA: Diagnosis not present

## 2019-03-29 DIAGNOSIS — C61 Malignant neoplasm of prostate: Secondary | ICD-10-CM | POA: Diagnosis not present

## 2019-03-30 DIAGNOSIS — Z510 Encounter for antineoplastic radiation therapy: Secondary | ICD-10-CM | POA: Diagnosis not present

## 2019-03-30 DIAGNOSIS — C61 Malignant neoplasm of prostate: Secondary | ICD-10-CM | POA: Diagnosis not present

## 2019-03-31 DIAGNOSIS — C61 Malignant neoplasm of prostate: Secondary | ICD-10-CM | POA: Diagnosis not present

## 2019-03-31 DIAGNOSIS — Z510 Encounter for antineoplastic radiation therapy: Secondary | ICD-10-CM | POA: Diagnosis not present

## 2019-04-03 DIAGNOSIS — C61 Malignant neoplasm of prostate: Secondary | ICD-10-CM | POA: Diagnosis not present

## 2019-04-03 DIAGNOSIS — Z510 Encounter for antineoplastic radiation therapy: Secondary | ICD-10-CM | POA: Diagnosis not present

## 2019-04-04 DIAGNOSIS — C61 Malignant neoplasm of prostate: Secondary | ICD-10-CM | POA: Diagnosis not present

## 2019-04-04 DIAGNOSIS — Z510 Encounter for antineoplastic radiation therapy: Secondary | ICD-10-CM | POA: Diagnosis not present

## 2019-04-05 DIAGNOSIS — C61 Malignant neoplasm of prostate: Secondary | ICD-10-CM | POA: Diagnosis not present

## 2019-04-05 DIAGNOSIS — Z510 Encounter for antineoplastic radiation therapy: Secondary | ICD-10-CM | POA: Diagnosis not present

## 2019-04-06 DIAGNOSIS — C61 Malignant neoplasm of prostate: Secondary | ICD-10-CM | POA: Diagnosis not present

## 2019-04-06 DIAGNOSIS — Z510 Encounter for antineoplastic radiation therapy: Secondary | ICD-10-CM | POA: Diagnosis not present

## 2019-04-07 DIAGNOSIS — C61 Malignant neoplasm of prostate: Secondary | ICD-10-CM | POA: Diagnosis not present

## 2019-04-07 DIAGNOSIS — Z510 Encounter for antineoplastic radiation therapy: Secondary | ICD-10-CM | POA: Diagnosis not present

## 2019-04-10 DIAGNOSIS — Z510 Encounter for antineoplastic radiation therapy: Secondary | ICD-10-CM | POA: Diagnosis not present

## 2019-04-10 DIAGNOSIS — C61 Malignant neoplasm of prostate: Secondary | ICD-10-CM | POA: Diagnosis not present

## 2019-04-11 DIAGNOSIS — Z510 Encounter for antineoplastic radiation therapy: Secondary | ICD-10-CM | POA: Diagnosis not present

## 2019-04-11 DIAGNOSIS — C61 Malignant neoplasm of prostate: Secondary | ICD-10-CM | POA: Diagnosis not present

## 2019-04-12 DIAGNOSIS — C61 Malignant neoplasm of prostate: Secondary | ICD-10-CM | POA: Diagnosis not present

## 2019-04-12 DIAGNOSIS — Z510 Encounter for antineoplastic radiation therapy: Secondary | ICD-10-CM | POA: Diagnosis not present

## 2019-04-13 DIAGNOSIS — C61 Malignant neoplasm of prostate: Secondary | ICD-10-CM | POA: Diagnosis not present

## 2019-04-13 DIAGNOSIS — Z510 Encounter for antineoplastic radiation therapy: Secondary | ICD-10-CM | POA: Diagnosis not present

## 2019-04-14 DIAGNOSIS — Z510 Encounter for antineoplastic radiation therapy: Secondary | ICD-10-CM | POA: Diagnosis not present

## 2019-04-14 DIAGNOSIS — C61 Malignant neoplasm of prostate: Secondary | ICD-10-CM | POA: Diagnosis not present

## 2019-04-17 DIAGNOSIS — C61 Malignant neoplasm of prostate: Secondary | ICD-10-CM | POA: Diagnosis not present

## 2019-04-17 DIAGNOSIS — Z510 Encounter for antineoplastic radiation therapy: Secondary | ICD-10-CM | POA: Diagnosis not present

## 2019-04-18 DIAGNOSIS — C61 Malignant neoplasm of prostate: Secondary | ICD-10-CM | POA: Diagnosis not present

## 2019-04-18 DIAGNOSIS — Z510 Encounter for antineoplastic radiation therapy: Secondary | ICD-10-CM | POA: Diagnosis not present

## 2019-04-19 DIAGNOSIS — Z510 Encounter for antineoplastic radiation therapy: Secondary | ICD-10-CM | POA: Diagnosis not present

## 2019-04-19 DIAGNOSIS — C61 Malignant neoplasm of prostate: Secondary | ICD-10-CM | POA: Diagnosis not present

## 2019-04-20 DIAGNOSIS — Z510 Encounter for antineoplastic radiation therapy: Secondary | ICD-10-CM | POA: Diagnosis not present

## 2019-04-20 DIAGNOSIS — C61 Malignant neoplasm of prostate: Secondary | ICD-10-CM | POA: Diagnosis not present

## 2019-04-21 DIAGNOSIS — C61 Malignant neoplasm of prostate: Secondary | ICD-10-CM | POA: Diagnosis not present

## 2019-04-21 DIAGNOSIS — Z510 Encounter for antineoplastic radiation therapy: Secondary | ICD-10-CM | POA: Diagnosis not present

## 2019-04-24 DIAGNOSIS — Z510 Encounter for antineoplastic radiation therapy: Secondary | ICD-10-CM | POA: Diagnosis not present

## 2019-04-24 DIAGNOSIS — C61 Malignant neoplasm of prostate: Secondary | ICD-10-CM | POA: Diagnosis not present

## 2019-04-25 DIAGNOSIS — C61 Malignant neoplasm of prostate: Secondary | ICD-10-CM | POA: Diagnosis not present

## 2019-04-25 DIAGNOSIS — Z510 Encounter for antineoplastic radiation therapy: Secondary | ICD-10-CM | POA: Diagnosis not present

## 2019-04-26 DIAGNOSIS — C61 Malignant neoplasm of prostate: Secondary | ICD-10-CM | POA: Diagnosis not present

## 2019-04-26 DIAGNOSIS — Z510 Encounter for antineoplastic radiation therapy: Secondary | ICD-10-CM | POA: Diagnosis not present

## 2019-04-27 DIAGNOSIS — Z510 Encounter for antineoplastic radiation therapy: Secondary | ICD-10-CM | POA: Diagnosis not present

## 2019-04-27 DIAGNOSIS — C61 Malignant neoplasm of prostate: Secondary | ICD-10-CM | POA: Diagnosis not present

## 2019-04-28 DIAGNOSIS — C61 Malignant neoplasm of prostate: Secondary | ICD-10-CM | POA: Diagnosis not present

## 2019-04-28 DIAGNOSIS — Z510 Encounter for antineoplastic radiation therapy: Secondary | ICD-10-CM | POA: Diagnosis not present

## 2019-05-01 DIAGNOSIS — Z510 Encounter for antineoplastic radiation therapy: Secondary | ICD-10-CM | POA: Diagnosis not present

## 2019-05-01 DIAGNOSIS — C61 Malignant neoplasm of prostate: Secondary | ICD-10-CM | POA: Diagnosis not present

## 2019-05-02 DIAGNOSIS — C61 Malignant neoplasm of prostate: Secondary | ICD-10-CM | POA: Diagnosis not present

## 2019-05-02 DIAGNOSIS — Z510 Encounter for antineoplastic radiation therapy: Secondary | ICD-10-CM | POA: Diagnosis not present

## 2019-05-03 DIAGNOSIS — C61 Malignant neoplasm of prostate: Secondary | ICD-10-CM | POA: Diagnosis not present

## 2019-05-03 DIAGNOSIS — Z510 Encounter for antineoplastic radiation therapy: Secondary | ICD-10-CM | POA: Diagnosis not present

## 2019-06-09 DIAGNOSIS — C61 Malignant neoplasm of prostate: Secondary | ICD-10-CM | POA: Diagnosis not present

## 2019-06-09 DIAGNOSIS — I160 Hypertensive urgency: Secondary | ICD-10-CM | POA: Diagnosis not present

## 2019-07-06 DIAGNOSIS — R0789 Other chest pain: Secondary | ICD-10-CM | POA: Diagnosis not present

## 2019-07-06 DIAGNOSIS — R079 Chest pain, unspecified: Secondary | ICD-10-CM | POA: Diagnosis not present

## 2019-07-06 DIAGNOSIS — Z79899 Other long term (current) drug therapy: Secondary | ICD-10-CM | POA: Diagnosis not present

## 2019-07-06 DIAGNOSIS — I480 Paroxysmal atrial fibrillation: Secondary | ICD-10-CM | POA: Diagnosis not present

## 2019-07-06 DIAGNOSIS — Z7982 Long term (current) use of aspirin: Secondary | ICD-10-CM | POA: Diagnosis not present

## 2019-07-06 DIAGNOSIS — I517 Cardiomegaly: Secondary | ICD-10-CM | POA: Diagnosis not present

## 2019-07-11 DIAGNOSIS — K625 Hemorrhage of anus and rectum: Secondary | ICD-10-CM | POA: Diagnosis not present

## 2019-07-11 DIAGNOSIS — R140 Abdominal distension (gaseous): Secondary | ICD-10-CM | POA: Diagnosis not present

## 2019-07-11 DIAGNOSIS — Z86010 Personal history of colonic polyps: Secondary | ICD-10-CM | POA: Diagnosis not present

## 2019-07-11 DIAGNOSIS — R1084 Generalized abdominal pain: Secondary | ICD-10-CM | POA: Diagnosis not present

## 2019-07-17 ENCOUNTER — Other Ambulatory Visit: Payer: Self-pay

## 2019-07-17 ENCOUNTER — Ambulatory Visit (INDEPENDENT_AMBULATORY_CARE_PROVIDER_SITE_OTHER): Payer: Medicare HMO

## 2019-07-17 ENCOUNTER — Other Ambulatory Visit: Payer: Self-pay | Admitting: Unknown Physician Specialty

## 2019-07-17 DIAGNOSIS — Z0000 Encounter for general adult medical examination without abnormal findings: Secondary | ICD-10-CM | POA: Diagnosis not present

## 2019-07-17 DIAGNOSIS — R197 Diarrhea, unspecified: Secondary | ICD-10-CM | POA: Diagnosis not present

## 2019-07-17 DIAGNOSIS — R5383 Other fatigue: Secondary | ICD-10-CM | POA: Diagnosis not present

## 2019-07-17 DIAGNOSIS — J45909 Unspecified asthma, uncomplicated: Secondary | ICD-10-CM | POA: Diagnosis not present

## 2019-07-17 DIAGNOSIS — R109 Unspecified abdominal pain: Secondary | ICD-10-CM

## 2019-07-17 DIAGNOSIS — G479 Sleep disorder, unspecified: Secondary | ICD-10-CM | POA: Diagnosis not present

## 2019-07-17 DIAGNOSIS — E7800 Pure hypercholesterolemia, unspecified: Secondary | ICD-10-CM | POA: Diagnosis not present

## 2019-07-17 DIAGNOSIS — I10 Essential (primary) hypertension: Secondary | ICD-10-CM | POA: Diagnosis not present

## 2019-07-21 DIAGNOSIS — D123 Benign neoplasm of transverse colon: Secondary | ICD-10-CM | POA: Diagnosis not present

## 2019-07-21 DIAGNOSIS — K625 Hemorrhage of anus and rectum: Secondary | ICD-10-CM | POA: Diagnosis not present

## 2019-07-21 DIAGNOSIS — K635 Polyp of colon: Secondary | ICD-10-CM | POA: Diagnosis not present

## 2019-07-21 DIAGNOSIS — K5730 Diverticulosis of large intestine without perforation or abscess without bleeding: Secondary | ICD-10-CM | POA: Diagnosis not present

## 2019-07-21 DIAGNOSIS — D120 Benign neoplasm of cecum: Secondary | ICD-10-CM | POA: Diagnosis not present

## 2019-07-26 DIAGNOSIS — I160 Hypertensive urgency: Secondary | ICD-10-CM | POA: Diagnosis not present

## 2019-07-26 DIAGNOSIS — J988 Other specified respiratory disorders: Secondary | ICD-10-CM | POA: Diagnosis not present

## 2019-07-26 DIAGNOSIS — R0602 Shortness of breath: Secondary | ICD-10-CM | POA: Diagnosis not present

## 2019-07-27 DIAGNOSIS — R0600 Dyspnea, unspecified: Secondary | ICD-10-CM | POA: Diagnosis not present

## 2019-09-04 DIAGNOSIS — M25561 Pain in right knee: Secondary | ICD-10-CM | POA: Diagnosis not present

## 2019-09-17 DIAGNOSIS — Z01818 Encounter for other preprocedural examination: Secondary | ICD-10-CM | POA: Diagnosis not present

## 2019-09-20 DIAGNOSIS — Z86010 Personal history of colonic polyps: Secondary | ICD-10-CM | POA: Diagnosis not present

## 2019-09-20 DIAGNOSIS — G8918 Other acute postprocedural pain: Secondary | ICD-10-CM | POA: Diagnosis not present

## 2019-09-20 DIAGNOSIS — I10 Essential (primary) hypertension: Secondary | ICD-10-CM | POA: Diagnosis not present

## 2019-09-20 DIAGNOSIS — E039 Hypothyroidism, unspecified: Secondary | ICD-10-CM | POA: Diagnosis not present

## 2019-09-20 DIAGNOSIS — Z79899 Other long term (current) drug therapy: Secondary | ICD-10-CM | POA: Diagnosis not present

## 2019-09-20 DIAGNOSIS — I480 Paroxysmal atrial fibrillation: Secondary | ICD-10-CM | POA: Diagnosis not present

## 2019-09-20 DIAGNOSIS — S83241A Other tear of medial meniscus, current injury, right knee, initial encounter: Secondary | ICD-10-CM | POA: Diagnosis not present

## 2019-09-20 DIAGNOSIS — S83231A Complex tear of medial meniscus, current injury, right knee, initial encounter: Secondary | ICD-10-CM | POA: Diagnosis not present

## 2019-09-20 DIAGNOSIS — Z7982 Long term (current) use of aspirin: Secondary | ICD-10-CM | POA: Diagnosis not present

## 2019-09-20 DIAGNOSIS — M2241 Chondromalacia patellae, right knee: Secondary | ICD-10-CM | POA: Diagnosis not present

## 2019-09-20 DIAGNOSIS — E785 Hyperlipidemia, unspecified: Secondary | ICD-10-CM | POA: Diagnosis not present

## 2019-10-04 DIAGNOSIS — Z20822 Contact with and (suspected) exposure to covid-19: Secondary | ICD-10-CM | POA: Diagnosis not present

## 2019-10-04 DIAGNOSIS — J42 Unspecified chronic bronchitis: Secondary | ICD-10-CM | POA: Diagnosis not present

## 2019-11-06 DIAGNOSIS — J4520 Mild intermittent asthma, uncomplicated: Secondary | ICD-10-CM | POA: Diagnosis not present

## 2019-11-06 DIAGNOSIS — J984 Other disorders of lung: Secondary | ICD-10-CM | POA: Diagnosis not present

## 2019-11-06 DIAGNOSIS — R0600 Dyspnea, unspecified: Secondary | ICD-10-CM | POA: Diagnosis not present

## 2019-11-08 DIAGNOSIS — L905 Scar conditions and fibrosis of skin: Secondary | ICD-10-CM | POA: Diagnosis not present

## 2019-11-08 DIAGNOSIS — R918 Other nonspecific abnormal finding of lung field: Secondary | ICD-10-CM | POA: Diagnosis not present

## 2019-11-08 DIAGNOSIS — D235 Other benign neoplasm of skin of trunk: Secondary | ICD-10-CM | POA: Diagnosis not present

## 2019-11-08 DIAGNOSIS — L821 Other seborrheic keratosis: Secondary | ICD-10-CM | POA: Diagnosis not present

## 2019-11-08 DIAGNOSIS — D485 Neoplasm of uncertain behavior of skin: Secondary | ICD-10-CM | POA: Diagnosis not present

## 2019-11-08 DIAGNOSIS — R0600 Dyspnea, unspecified: Secondary | ICD-10-CM | POA: Diagnosis not present

## 2019-11-13 DIAGNOSIS — J849 Interstitial pulmonary disease, unspecified: Secondary | ICD-10-CM | POA: Diagnosis not present

## 2019-11-13 DIAGNOSIS — J8410 Pulmonary fibrosis, unspecified: Secondary | ICD-10-CM | POA: Diagnosis not present

## 2019-11-14 DIAGNOSIS — C73 Malignant neoplasm of thyroid gland: Secondary | ICD-10-CM | POA: Diagnosis not present

## 2019-11-14 DIAGNOSIS — E890 Postprocedural hypothyroidism: Secondary | ICD-10-CM | POA: Diagnosis not present

## 2019-11-14 DIAGNOSIS — E892 Postprocedural hypoparathyroidism: Secondary | ICD-10-CM | POA: Diagnosis not present

## 2019-11-15 DIAGNOSIS — I480 Paroxysmal atrial fibrillation: Secondary | ICD-10-CM | POA: Diagnosis not present

## 2019-11-15 DIAGNOSIS — I10 Essential (primary) hypertension: Secondary | ICD-10-CM | POA: Diagnosis not present

## 2019-11-15 DIAGNOSIS — Z79899 Other long term (current) drug therapy: Secondary | ICD-10-CM | POA: Diagnosis not present

## 2019-11-15 DIAGNOSIS — R0609 Other forms of dyspnea: Secondary | ICD-10-CM | POA: Diagnosis not present

## 2019-11-15 DIAGNOSIS — Z7982 Long term (current) use of aspirin: Secondary | ICD-10-CM | POA: Diagnosis not present

## 2019-11-15 DIAGNOSIS — R0602 Shortness of breath: Secondary | ICD-10-CM | POA: Diagnosis not present

## 2019-11-15 DIAGNOSIS — Z9889 Other specified postprocedural states: Secondary | ICD-10-CM | POA: Diagnosis not present

## 2019-11-15 DIAGNOSIS — I499 Cardiac arrhythmia, unspecified: Secondary | ICD-10-CM | POA: Diagnosis not present

## 2019-11-16 DIAGNOSIS — R2241 Localized swelling, mass and lump, right lower limb: Secondary | ICD-10-CM | POA: Diagnosis not present

## 2019-11-16 DIAGNOSIS — M25561 Pain in right knee: Secondary | ICD-10-CM | POA: Diagnosis not present

## 2019-11-16 DIAGNOSIS — I480 Paroxysmal atrial fibrillation: Secondary | ICD-10-CM | POA: Diagnosis not present

## 2019-11-16 DIAGNOSIS — M7989 Other specified soft tissue disorders: Secondary | ICD-10-CM | POA: Diagnosis not present

## 2019-11-23 DIAGNOSIS — I10 Essential (primary) hypertension: Secondary | ICD-10-CM | POA: Diagnosis not present

## 2019-11-23 DIAGNOSIS — C61 Malignant neoplasm of prostate: Secondary | ICD-10-CM | POA: Diagnosis not present

## 2019-11-23 DIAGNOSIS — R0600 Dyspnea, unspecified: Secondary | ICD-10-CM | POA: Diagnosis not present

## 2019-11-23 DIAGNOSIS — R0602 Shortness of breath: Secondary | ICD-10-CM | POA: Diagnosis not present

## 2019-11-23 DIAGNOSIS — C73 Malignant neoplasm of thyroid gland: Secondary | ICD-10-CM | POA: Diagnosis not present

## 2019-11-23 DIAGNOSIS — I480 Paroxysmal atrial fibrillation: Secondary | ICD-10-CM | POA: Diagnosis not present

## 2019-11-26 DIAGNOSIS — I480 Paroxysmal atrial fibrillation: Secondary | ICD-10-CM | POA: Diagnosis not present

## 2019-11-28 DIAGNOSIS — R768 Other specified abnormal immunological findings in serum: Secondary | ICD-10-CM | POA: Diagnosis not present

## 2019-11-28 DIAGNOSIS — J9811 Atelectasis: Secondary | ICD-10-CM | POA: Diagnosis not present

## 2019-11-28 DIAGNOSIS — R0602 Shortness of breath: Secondary | ICD-10-CM | POA: Diagnosis not present

## 2019-12-01 DIAGNOSIS — R0789 Other chest pain: Secondary | ICD-10-CM | POA: Diagnosis not present

## 2019-12-01 DIAGNOSIS — Z85850 Personal history of malignant neoplasm of thyroid: Secondary | ICD-10-CM | POA: Diagnosis not present

## 2019-12-01 DIAGNOSIS — Z20822 Contact with and (suspected) exposure to covid-19: Secondary | ICD-10-CM | POA: Diagnosis not present

## 2019-12-01 DIAGNOSIS — Z8546 Personal history of malignant neoplasm of prostate: Secondary | ICD-10-CM | POA: Diagnosis not present

## 2019-12-01 DIAGNOSIS — R0600 Dyspnea, unspecified: Secondary | ICD-10-CM | POA: Diagnosis not present

## 2019-12-01 DIAGNOSIS — I4891 Unspecified atrial fibrillation: Secondary | ICD-10-CM | POA: Diagnosis not present

## 2019-12-01 DIAGNOSIS — R0602 Shortness of breath: Secondary | ICD-10-CM | POA: Diagnosis not present

## 2019-12-01 DIAGNOSIS — R072 Precordial pain: Secondary | ICD-10-CM | POA: Diagnosis not present

## 2019-12-01 DIAGNOSIS — R600 Localized edema: Secondary | ICD-10-CM | POA: Diagnosis not present

## 2019-12-01 DIAGNOSIS — I10 Essential (primary) hypertension: Secondary | ICD-10-CM | POA: Diagnosis not present

## 2019-12-04 DIAGNOSIS — I471 Supraventricular tachycardia: Secondary | ICD-10-CM | POA: Diagnosis not present

## 2019-12-11 DIAGNOSIS — J984 Other disorders of lung: Secondary | ICD-10-CM | POA: Diagnosis not present

## 2019-12-11 DIAGNOSIS — R0600 Dyspnea, unspecified: Secondary | ICD-10-CM | POA: Diagnosis not present

## 2019-12-11 DIAGNOSIS — J8410 Pulmonary fibrosis, unspecified: Secondary | ICD-10-CM | POA: Diagnosis not present

## 2019-12-12 DIAGNOSIS — C61 Malignant neoplasm of prostate: Secondary | ICD-10-CM | POA: Diagnosis not present

## 2020-01-11 DIAGNOSIS — J984 Other disorders of lung: Secondary | ICD-10-CM | POA: Diagnosis not present

## 2020-01-11 DIAGNOSIS — R0600 Dyspnea, unspecified: Secondary | ICD-10-CM | POA: Diagnosis not present

## 2020-01-11 DIAGNOSIS — J8410 Pulmonary fibrosis, unspecified: Secondary | ICD-10-CM | POA: Diagnosis not present

## 2020-01-11 DIAGNOSIS — J849 Interstitial pulmonary disease, unspecified: Secondary | ICD-10-CM | POA: Diagnosis not present

## 2020-01-18 IMAGING — DX DG ABDOMEN 2V
4 series · 4 of 4 positions shown · non-contrast
Comparison: None.

CLINICAL DATA: Abdominal pain and diarrhea for 3 months.

EXAM:
ABDOMEN - 2 VIEW

[abdomen erect]
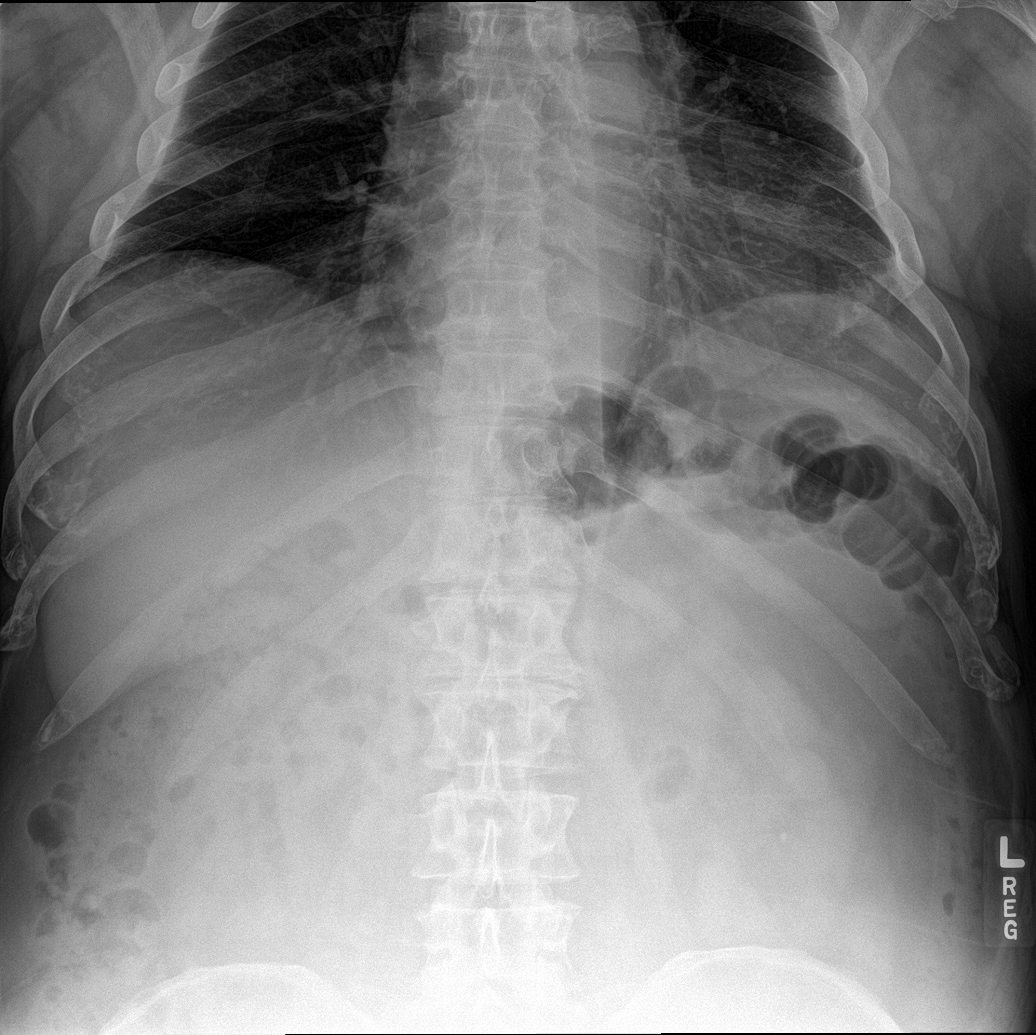

[abdomen supine (1 of 3)]
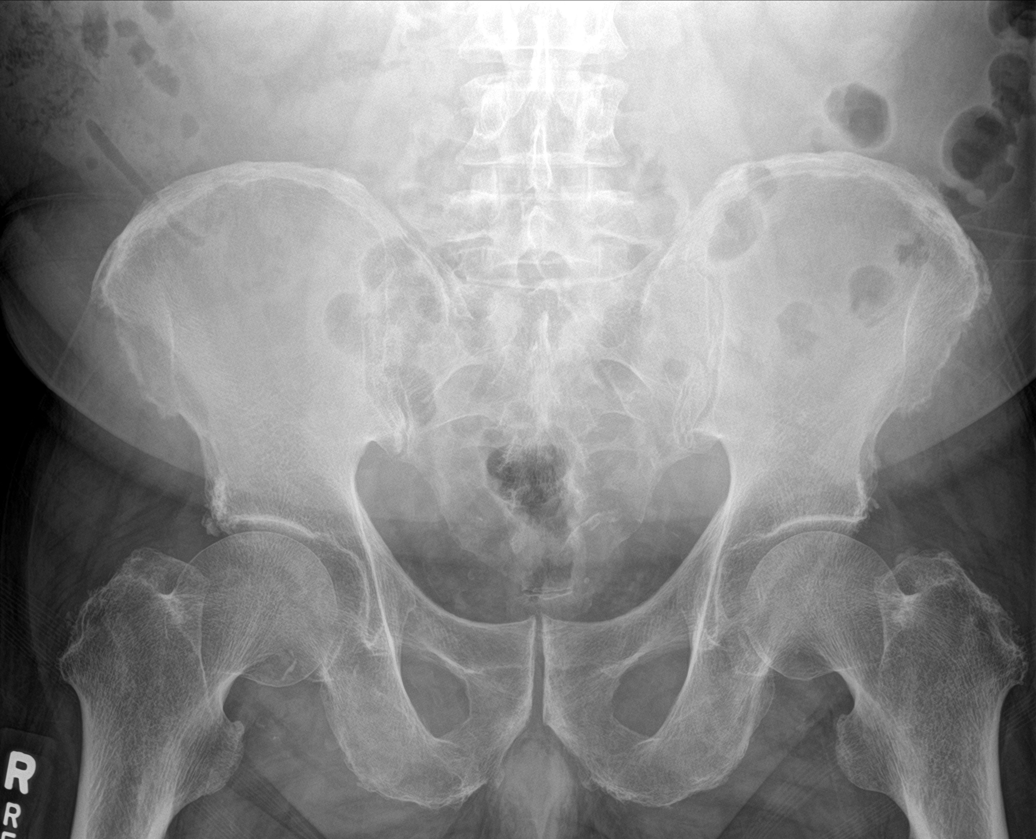

[abdomen supine (2 of 3)]
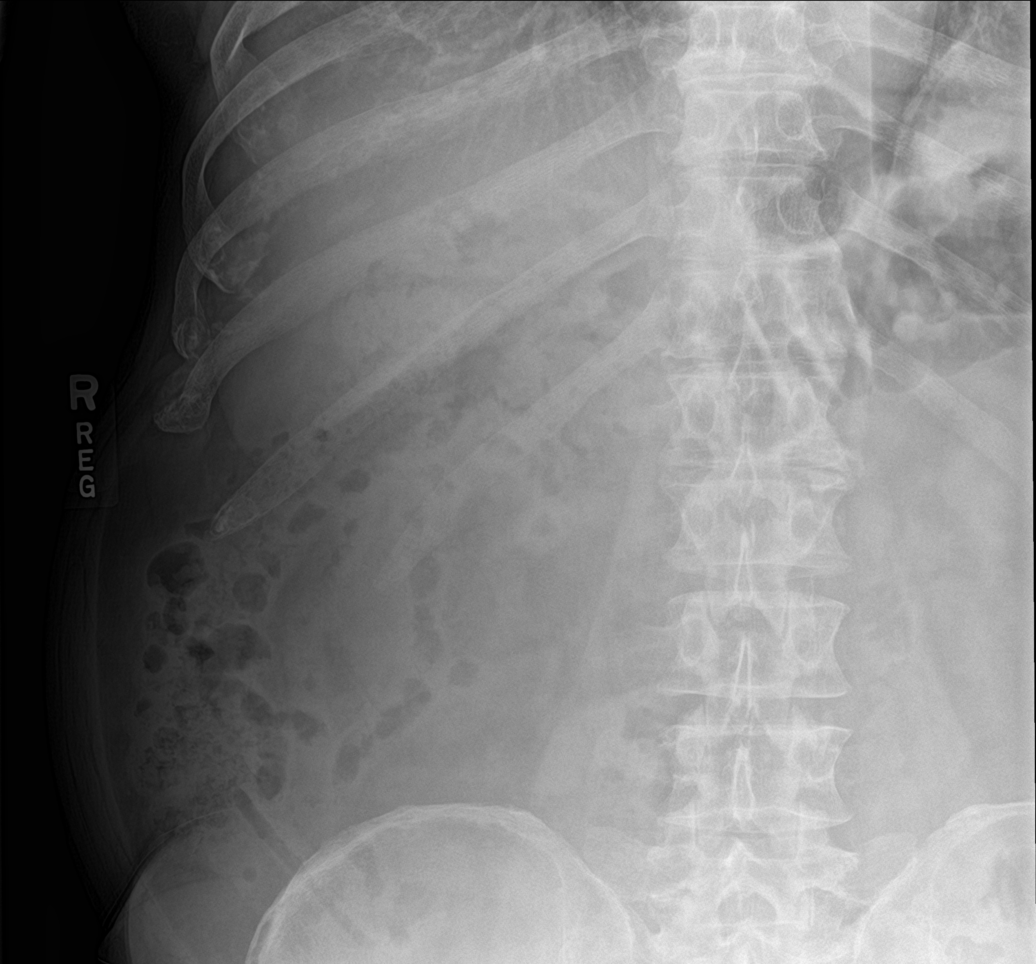

[abdomen supine (3 of 3)]
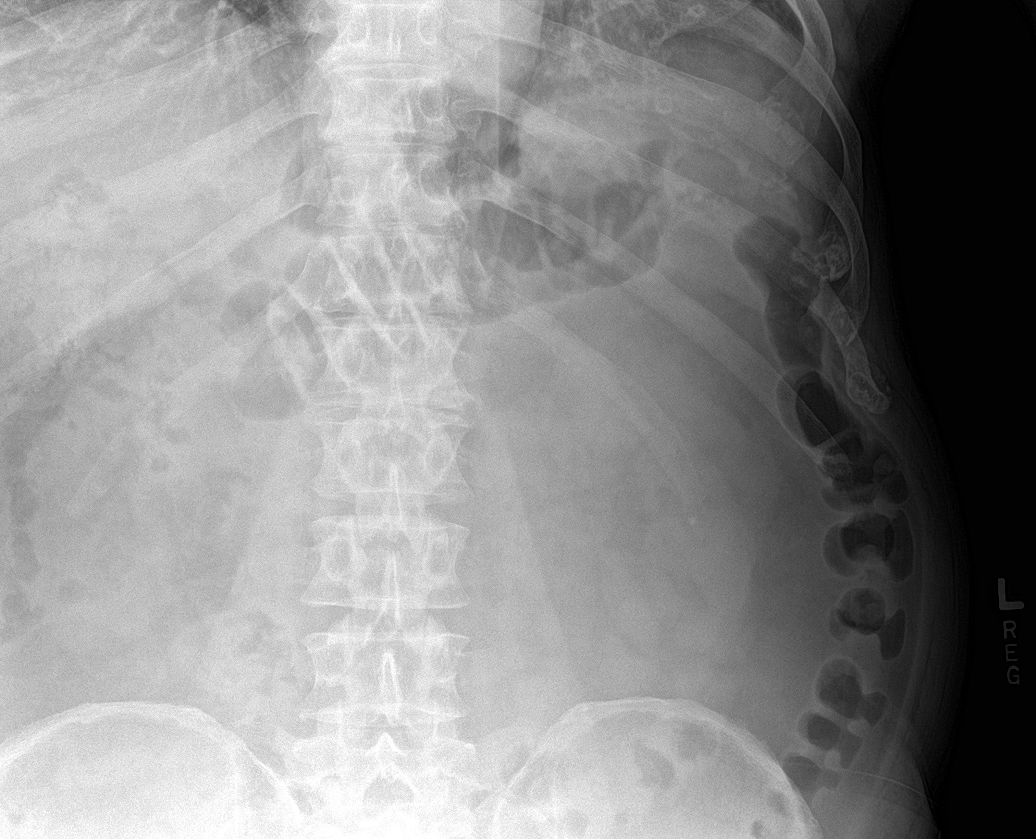

[4 of 4 positions shown; findings below may reference images not displayed]

FINDINGS: The bowel gas pattern is normal. There is no evidence of free air. A
punctate calcification projecting over the midpole of the left
kidney may be a nonobstructing stone.
IMPRESSION: Normal bowel gas pattern.  No free air.

Possible punctate nonobstructing stone left kidney.

## 2020-01-23 DIAGNOSIS — C61 Malignant neoplasm of prostate: Secondary | ICD-10-CM | POA: Diagnosis not present

## 2020-01-23 DIAGNOSIS — E892 Postprocedural hypoparathyroidism: Secondary | ICD-10-CM | POA: Diagnosis not present

## 2020-02-22 DIAGNOSIS — R739 Hyperglycemia, unspecified: Secondary | ICD-10-CM | POA: Diagnosis not present

## 2020-02-22 DIAGNOSIS — G479 Sleep disorder, unspecified: Secondary | ICD-10-CM | POA: Diagnosis not present

## 2020-02-22 DIAGNOSIS — E7800 Pure hypercholesterolemia, unspecified: Secondary | ICD-10-CM | POA: Diagnosis not present

## 2020-02-22 DIAGNOSIS — N529 Male erectile dysfunction, unspecified: Secondary | ICD-10-CM | POA: Diagnosis not present

## 2020-03-03 DIAGNOSIS — Z8546 Personal history of malignant neoplasm of prostate: Secondary | ICD-10-CM | POA: Diagnosis not present

## 2020-03-03 DIAGNOSIS — Z20822 Contact with and (suspected) exposure to covid-19: Secondary | ICD-10-CM | POA: Diagnosis not present

## 2020-03-03 DIAGNOSIS — E039 Hypothyroidism, unspecified: Secondary | ICD-10-CM | POA: Diagnosis not present

## 2020-03-03 DIAGNOSIS — I10 Essential (primary) hypertension: Secondary | ICD-10-CM | POA: Diagnosis not present

## 2020-03-03 DIAGNOSIS — R918 Other nonspecific abnormal finding of lung field: Secondary | ICD-10-CM | POA: Diagnosis not present

## 2020-03-03 DIAGNOSIS — J189 Pneumonia, unspecified organism: Secondary | ICD-10-CM | POA: Diagnosis not present

## 2020-03-03 DIAGNOSIS — I480 Paroxysmal atrial fibrillation: Secondary | ICD-10-CM | POA: Diagnosis not present

## 2020-03-03 DIAGNOSIS — E785 Hyperlipidemia, unspecified: Secondary | ICD-10-CM | POA: Diagnosis not present

## 2020-03-03 DIAGNOSIS — R9431 Abnormal electrocardiogram [ECG] [EKG]: Secondary | ICD-10-CM | POA: Diagnosis not present

## 2020-03-03 DIAGNOSIS — J9601 Acute respiratory failure with hypoxia: Secondary | ICD-10-CM | POA: Diagnosis not present

## 2020-03-03 DIAGNOSIS — J168 Pneumonia due to other specified infectious organisms: Secondary | ICD-10-CM | POA: Diagnosis not present

## 2020-03-03 DIAGNOSIS — R0602 Shortness of breath: Secondary | ICD-10-CM | POA: Diagnosis not present

## 2020-03-03 DIAGNOSIS — E7800 Pure hypercholesterolemia, unspecified: Secondary | ICD-10-CM | POA: Diagnosis not present

## 2020-03-04 DIAGNOSIS — I359 Nonrheumatic aortic valve disorder, unspecified: Secondary | ICD-10-CM | POA: Diagnosis not present

## 2020-03-04 DIAGNOSIS — I517 Cardiomegaly: Secondary | ICD-10-CM | POA: Diagnosis not present

## 2020-03-04 DIAGNOSIS — J9601 Acute respiratory failure with hypoxia: Secondary | ICD-10-CM | POA: Diagnosis not present

## 2020-03-04 DIAGNOSIS — I081 Rheumatic disorders of both mitral and tricuspid valves: Secondary | ICD-10-CM | POA: Diagnosis not present

## 2020-05-08 DIAGNOSIS — U071 COVID-19: Secondary | ICD-10-CM | POA: Diagnosis not present

## 2020-05-08 DIAGNOSIS — R0682 Tachypnea, not elsewhere classified: Secondary | ICD-10-CM | POA: Diagnosis not present

## 2020-05-08 DIAGNOSIS — R0902 Hypoxemia: Secondary | ICD-10-CM | POA: Diagnosis not present

## 2020-05-08 DIAGNOSIS — Z79899 Other long term (current) drug therapy: Secondary | ICD-10-CM | POA: Diagnosis not present

## 2020-05-08 DIAGNOSIS — J1282 Pneumonia due to coronavirus disease 2019: Secondary | ICD-10-CM | POA: Diagnosis not present

## 2020-05-08 DIAGNOSIS — R918 Other nonspecific abnormal finding of lung field: Secondary | ICD-10-CM | POA: Diagnosis not present

## 2020-05-08 DIAGNOSIS — Z79890 Hormone replacement therapy (postmenopausal): Secondary | ICD-10-CM | POA: Diagnosis not present

## 2020-05-08 DIAGNOSIS — Z85850 Personal history of malignant neoplasm of thyroid: Secondary | ICD-10-CM | POA: Diagnosis not present

## 2020-05-08 DIAGNOSIS — J984 Other disorders of lung: Secondary | ICD-10-CM | POA: Diagnosis not present

## 2020-05-08 DIAGNOSIS — Z8546 Personal history of malignant neoplasm of prostate: Secondary | ICD-10-CM | POA: Diagnosis not present

## 2020-05-08 DIAGNOSIS — I10 Essential (primary) hypertension: Secondary | ICD-10-CM | POA: Diagnosis not present

## 2020-05-08 DIAGNOSIS — J9601 Acute respiratory failure with hypoxia: Secondary | ICD-10-CM | POA: Diagnosis not present

## 2020-05-08 DIAGNOSIS — R0602 Shortness of breath: Secondary | ICD-10-CM | POA: Diagnosis not present

## 2020-05-08 DIAGNOSIS — Z7982 Long term (current) use of aspirin: Secondary | ICD-10-CM | POA: Diagnosis not present

## 2020-05-09 DIAGNOSIS — U071 COVID-19: Secondary | ICD-10-CM | POA: Diagnosis not present

## 2020-05-09 DIAGNOSIS — J1282 Pneumonia due to coronavirus disease 2019: Secondary | ICD-10-CM | POA: Diagnosis not present

## 2020-05-10 DIAGNOSIS — J1282 Pneumonia due to coronavirus disease 2019: Secondary | ICD-10-CM | POA: Diagnosis not present

## 2020-05-10 DIAGNOSIS — U071 COVID-19: Secondary | ICD-10-CM | POA: Diagnosis not present

## 2020-05-23 DIAGNOSIS — E785 Hyperlipidemia, unspecified: Secondary | ICD-10-CM | POA: Diagnosis not present

## 2020-05-23 DIAGNOSIS — J189 Pneumonia, unspecified organism: Secondary | ICD-10-CM | POA: Diagnosis not present

## 2020-05-23 DIAGNOSIS — U071 COVID-19: Secondary | ICD-10-CM | POA: Diagnosis not present

## 2020-05-23 DIAGNOSIS — I10 Essential (primary) hypertension: Secondary | ICD-10-CM | POA: Diagnosis not present

## 2020-05-23 DIAGNOSIS — E7800 Pure hypercholesterolemia, unspecified: Secondary | ICD-10-CM | POA: Diagnosis not present

## 2020-05-31 DIAGNOSIS — I517 Cardiomegaly: Secondary | ICD-10-CM | POA: Diagnosis not present

## 2020-05-31 DIAGNOSIS — C61 Malignant neoplasm of prostate: Secondary | ICD-10-CM | POA: Diagnosis not present

## 2020-05-31 DIAGNOSIS — I480 Paroxysmal atrial fibrillation: Secondary | ICD-10-CM | POA: Diagnosis not present

## 2020-05-31 DIAGNOSIS — Z9889 Other specified postprocedural states: Secondary | ICD-10-CM | POA: Diagnosis not present

## 2020-05-31 DIAGNOSIS — Z79899 Other long term (current) drug therapy: Secondary | ICD-10-CM | POA: Diagnosis not present

## 2020-07-16 DIAGNOSIS — Z23 Encounter for immunization: Secondary | ICD-10-CM | POA: Diagnosis not present

## 2020-07-16 DIAGNOSIS — R5383 Other fatigue: Secondary | ICD-10-CM | POA: Diagnosis not present

## 2020-07-16 DIAGNOSIS — J984 Other disorders of lung: Secondary | ICD-10-CM | POA: Diagnosis not present

## 2020-07-16 DIAGNOSIS — I10 Essential (primary) hypertension: Secondary | ICD-10-CM | POA: Diagnosis not present

## 2020-07-16 DIAGNOSIS — N529 Male erectile dysfunction, unspecified: Secondary | ICD-10-CM | POA: Diagnosis not present

## 2020-07-16 DIAGNOSIS — Z0000 Encounter for general adult medical examination without abnormal findings: Secondary | ICD-10-CM | POA: Diagnosis not present

## 2020-07-16 DIAGNOSIS — R739 Hyperglycemia, unspecified: Secondary | ICD-10-CM | POA: Diagnosis not present

## 2020-07-16 DIAGNOSIS — E7800 Pure hypercholesterolemia, unspecified: Secondary | ICD-10-CM | POA: Diagnosis not present

## 2020-07-25 DIAGNOSIS — I160 Hypertensive urgency: Secondary | ICD-10-CM | POA: Diagnosis not present

## 2020-07-25 DIAGNOSIS — C61 Malignant neoplasm of prostate: Secondary | ICD-10-CM | POA: Diagnosis not present

## 2020-08-07 DIAGNOSIS — I160 Hypertensive urgency: Secondary | ICD-10-CM | POA: Diagnosis not present

## 2020-08-07 DIAGNOSIS — I83811 Varicose veins of right lower extremities with pain: Secondary | ICD-10-CM | POA: Diagnosis not present

## 2020-08-09 DIAGNOSIS — Z8546 Personal history of malignant neoplasm of prostate: Secondary | ICD-10-CM | POA: Diagnosis not present

## 2020-08-09 DIAGNOSIS — R069 Unspecified abnormalities of breathing: Secondary | ICD-10-CM | POA: Diagnosis not present

## 2020-08-09 DIAGNOSIS — I10 Essential (primary) hypertension: Secondary | ICD-10-CM | POA: Diagnosis not present

## 2020-08-09 DIAGNOSIS — Z85850 Personal history of malignant neoplasm of thyroid: Secondary | ICD-10-CM | POA: Diagnosis not present

## 2020-08-09 DIAGNOSIS — Z8616 Personal history of COVID-19: Secondary | ICD-10-CM | POA: Diagnosis not present

## 2020-08-09 DIAGNOSIS — J9601 Acute respiratory failure with hypoxia: Secondary | ICD-10-CM | POA: Diagnosis not present

## 2020-08-09 DIAGNOSIS — J9602 Acute respiratory failure with hypercapnia: Secondary | ICD-10-CM | POA: Diagnosis not present

## 2020-08-09 DIAGNOSIS — E890 Postprocedural hypothyroidism: Secondary | ICD-10-CM | POA: Diagnosis not present

## 2020-08-09 DIAGNOSIS — I517 Cardiomegaly: Secondary | ICD-10-CM | POA: Diagnosis not present

## 2020-08-09 DIAGNOSIS — I080 Rheumatic disorders of both mitral and aortic valves: Secondary | ICD-10-CM | POA: Diagnosis not present

## 2020-08-09 DIAGNOSIS — Z20822 Contact with and (suspected) exposure to covid-19: Secondary | ICD-10-CM | POA: Diagnosis not present

## 2020-08-09 DIAGNOSIS — Z7982 Long term (current) use of aspirin: Secondary | ICD-10-CM | POA: Diagnosis not present

## 2020-08-09 DIAGNOSIS — J80 Acute respiratory distress syndrome: Secondary | ICD-10-CM | POA: Diagnosis not present

## 2020-08-09 DIAGNOSIS — R9431 Abnormal electrocardiogram [ECG] [EKG]: Secondary | ICD-10-CM | POA: Diagnosis not present

## 2020-08-09 DIAGNOSIS — J984 Other disorders of lung: Secondary | ICD-10-CM | POA: Diagnosis not present

## 2020-08-09 DIAGNOSIS — R0689 Other abnormalities of breathing: Secondary | ICD-10-CM | POA: Diagnosis not present

## 2020-08-09 DIAGNOSIS — R0902 Hypoxemia: Secondary | ICD-10-CM | POA: Diagnosis not present

## 2020-08-09 DIAGNOSIS — J8410 Pulmonary fibrosis, unspecified: Secondary | ICD-10-CM | POA: Diagnosis not present

## 2020-08-09 DIAGNOSIS — R0602 Shortness of breath: Secondary | ICD-10-CM | POA: Diagnosis not present

## 2020-08-10 DIAGNOSIS — J9601 Acute respiratory failure with hypoxia: Secondary | ICD-10-CM | POA: Diagnosis not present

## 2020-08-10 DIAGNOSIS — Z8546 Personal history of malignant neoplasm of prostate: Secondary | ICD-10-CM | POA: Diagnosis not present

## 2020-08-10 DIAGNOSIS — Z85850 Personal history of malignant neoplasm of thyroid: Secondary | ICD-10-CM | POA: Diagnosis not present

## 2020-08-10 DIAGNOSIS — I080 Rheumatic disorders of both mitral and aortic valves: Secondary | ICD-10-CM | POA: Diagnosis not present

## 2020-08-10 DIAGNOSIS — I517 Cardiomegaly: Secondary | ICD-10-CM | POA: Diagnosis not present

## 2020-08-10 DIAGNOSIS — J9602 Acute respiratory failure with hypercapnia: Secondary | ICD-10-CM | POA: Diagnosis not present

## 2020-08-10 DIAGNOSIS — I10 Essential (primary) hypertension: Secondary | ICD-10-CM | POA: Diagnosis not present

## 2020-08-10 DIAGNOSIS — J984 Other disorders of lung: Secondary | ICD-10-CM | POA: Diagnosis not present

## 2020-08-11 DIAGNOSIS — I10 Essential (primary) hypertension: Secondary | ICD-10-CM | POA: Diagnosis not present

## 2020-08-11 DIAGNOSIS — J9602 Acute respiratory failure with hypercapnia: Secondary | ICD-10-CM | POA: Diagnosis not present

## 2020-08-11 DIAGNOSIS — J9601 Acute respiratory failure with hypoxia: Secondary | ICD-10-CM | POA: Diagnosis not present

## 2020-08-11 DIAGNOSIS — J984 Other disorders of lung: Secondary | ICD-10-CM | POA: Diagnosis not present

## 2020-08-11 DIAGNOSIS — Z85850 Personal history of malignant neoplasm of thyroid: Secondary | ICD-10-CM | POA: Diagnosis not present

## 2020-08-11 DIAGNOSIS — Z8546 Personal history of malignant neoplasm of prostate: Secondary | ICD-10-CM | POA: Diagnosis not present

## 2020-12-13 ENCOUNTER — Other Ambulatory Visit: Payer: Self-pay

## 2020-12-13 ENCOUNTER — Ambulatory Visit (INDEPENDENT_AMBULATORY_CARE_PROVIDER_SITE_OTHER): Payer: No Typology Code available for payment source

## 2020-12-13 ENCOUNTER — Ambulatory Visit (INDEPENDENT_AMBULATORY_CARE_PROVIDER_SITE_OTHER): Payer: No Typology Code available for payment source | Admitting: Internal Medicine

## 2020-12-13 ENCOUNTER — Encounter: Payer: Self-pay | Admitting: Internal Medicine

## 2020-12-13 DIAGNOSIS — R0609 Other forms of dyspnea: Secondary | ICD-10-CM

## 2020-12-13 DIAGNOSIS — R0600 Dyspnea, unspecified: Secondary | ICD-10-CM

## 2020-12-13 LAB — BASIC METABOLIC PANEL
BUN: 31 mg/dL — ABNORMAL HIGH (ref 6–23)
CO2: 38 mEq/L — ABNORMAL HIGH (ref 19–32)
Calcium: 8.9 mg/dL (ref 8.4–10.5)
Chloride: 99 mEq/L (ref 96–112)
Creatinine, Ser: 1.18 mg/dL (ref 0.40–1.50)
GFR: 61.36 mL/min (ref >60.00)
Glucose, Bld: 124 mg/dL — ABNORMAL HIGH (ref 70–99)
Potassium: 4.3 mEq/L (ref 3.5–5.1)
Sodium: 143 mEq/L (ref 135–145)

## 2020-12-13 LAB — CBC WITH DIFFERENTIAL/PLATELET
Basophils Absolute: 0 10*3/uL (ref 0.0–0.1)
Basophils Relative: 0.7 % (ref 0.0–3.0)
Eosinophils Absolute: 0.2 10*3/uL (ref 0.0–0.7)
Eosinophils Relative: 3.6 % (ref 0.0–5.0)
HCT: 49.2 % (ref 39.0–52.0)
Hemoglobin: 16 g/dL (ref 13.0–17.0)
Lymphocytes Relative: 17.9 % (ref 12.0–46.0)
Lymphs Abs: 1 10*3/uL (ref 0.7–4.0)
MCHC: 32.5 g/dL (ref 30.0–36.0)
MCV: 90.2 fl (ref 78.0–100.0)
Monocytes Absolute: 0.8 10*3/uL (ref 0.1–1.0)
Monocytes Relative: 14.6 % — ABNORMAL HIGH (ref 3.0–12.0)
Neutro Abs: 3.6 10*3/uL (ref 1.4–7.7)
Neutrophils Relative %: 63.2 % (ref 43.0–77.0)
Platelets: 177 10*3/uL (ref 150.0–400.0)
RBC: 5.45 Mil/uL (ref 4.22–5.81)
RDW: 16.2 % — ABNORMAL HIGH (ref 11.5–15.5)
WBC: 5.6 10*3/uL (ref 4.0–10.5)

## 2020-12-13 LAB — BRAIN NATRIURETIC PEPTIDE: Pro B Natriuretic peptide (BNP): 140 pg/mL — ABNORMAL HIGH (ref 0.0–100.0)

## 2020-12-13 LAB — TSH: TSH: 1.41 u[IU]/mL (ref 0.35–4.50)

## 2020-12-13 LAB — SEDIMENTATION RATE: Sed Rate: 8 mm/hr (ref 0–20)

## 2020-12-13 NOTE — Patient Instructions (Addendum)
Continue symbicort 160  for now one twice daily   Work on inhaler technique:  relax and gently blow all the way out then take a nice smooth deep breath back in, triggering the inhaler at same time you start breathing in.  Hold for up to 5 seconds if you can. Blow out thru nose. Rinse and gargle with water when done  - arm and hammer slurry after you finish  Please remember to go to the lab and x-ray department  for your tests - we will call you with the results when they are available.       Please schedule a follow up office visit in 6 weeks, call sooner if needed  PFT's on return

## 2020-12-13 NOTE — Assessment & Plan Note (Addendum)
Onset was with covid summer of 2021  Echo 12/08/20 LeftVentricle: Systolic function is normal. EF: 60-65%.  . LeftVentricle: No regional wall motion abnormalities noted.  Marland Kitchen LeftVentricle: Doppler parameters consistent with restrictive filling  pattern and markedly elevated LA pressure.  . LeftAtrium: Left atrium is moderately dilated.   . AorticValve: Trace aortic valve regurgitation.  . MitralValve: There is moderate posterior annular calcification.  . MitralValve: There is mildto moderate regurgitation with a centrally  directed jet. - HRCT chest 11/08/19  Neg ILD  -  12/13/2020   Walked RA  3 laps @ approx 241ft each @ moderately fast  pace  stopped due to end of study with sats still 93%  - 12/13/2020  After extensive coaching inhaler device,  effectiveness =    50% > continue symbicort 160 one bid pending pfts on return   Symptoms are markedly disproportionate to objective findings and not clear to what extent this is actually a pulmonary  problem but pt does appear to have difficult to sort out respiratory symptoms of unknown origin for which  DDX  = almost all start with A and  include Adherence, Ace Inhibitors, Acid Reflux, Active Sinus Disease, Alpha 1 Antitripsin deficiency, Anxiety masquerading as Airways dz,  ABPA,  Allergy(esp in young), Aspiration (esp in elderly), Adverse effects of meds,  Active smoking or Vaping, A bunch of PE's/clot burden (a few small clots can't cause this syndrome unless there is already severe underlying pulm or vascular dz with poor reserve),  Anemia or thyroid disorder, plus two Bs  = Bronchiectasis and Beta blocker use..and one C= CHF   Adherence is always the initial "prime suspect" and is a multilayered concern that requires a "trust but verify" approach in every patient - starting with knowing how to use medications, especially inhalers, correctly, keeping up with refills and understanding the fundamental difference between maintenance and prns vs  those medications only taken for a very short course and then stopped and not refilled.  - see hfa teaching - with all meds in hand using a trust but verify approach to confirm accurate Medication  Reconciliation The principal here is that until we are certain that the  patients are doing what we've asked, it makes no sense to ask them to do more.   Allergy/asthma > report of reversible airflow on pfts but they are mostly restrictive so asthma component likely mild or could be "cardiac asthma"  > just continue symbicort 160 one bid for now pending f/u pfts but no symbicort day of pfts   Anemia/ thyroid dz > ruled out today   ? Anxiety/depression/ deconditioning  > usually at the bottom of this list of usual suspects but   may interfere with adherence and also interpretation of response or lack thereof to symptom management which can be quite subjective.  - encouraged to start regular paced sub max ex building up to a min of a total of 30 min daily   ? A bunch of PEs > D dimer nl - while a normal  or high normal value (seen commonly in the elderly or chronically ill)  may miss small peripheral pe, the clot burden with sob is moderately high and the d dimer  has a very high neg pred value if used in this setting.    ? chf > had leg swelling which resolved with lasix and BNP in intermediate range so could have component of CHF/ MR contributing  but is probably over diuresed at present  so should just take lasix prn leg swelling at this point    F/u with pfts   Each maintenance medication was reviewed in detail including emphasizing most importantly the difference between maintenance and prns and under what circumstances the prns are to be triggered using an action plan format where appropriate.  Total time for H and P, chart review including extensive records in care everywhere from multiple sources, counseling, reviewing hfa device(s) , directly observing portions of ambulatory 02 saturation study/  and generating customized AVS unique to this office visit / same day charting = 60 min                .

## 2020-12-13 NOTE — Progress Notes (Signed)
Barry Escobar, male    DOB: [birth year],    MRN: [id number]   Brief patient profile:  49 yowm never smoker retired Emergency planning/management officer for [employer] x for thyroid ca surgery 2008 at [facility] and hoarse ever since (never told about a paralyzed cord) and retired from [employer] 2014 at wt around 200-210  with new onset recurrent bronchitis typically each December since retired rx pred/abx then covid Aug 2021 admitted with covid x 4 days, did not require 02  and not back to baseline so rx symbicort 160 on 1 bid then acute flare in nov 2021 resulted in low 02 req hosp for 1st time > left hosp and did about the same until [place] trip mid March 2022 > readmitted at [facility] > d/c off 02 and has neb but rarely needs so referred to pulmonary clinic 12/13/2020 by [facility] for second opinion with prior pfts by Dr Tilden Dome showing "severe restriction with response to bronchodilator"   History of Present Illness  12/13/2020  Pulmonary/ 1st office eval/Milly Goggins - 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 Consult    Sob started 1 year ago after having Bronchitis. Working in Engineer, site wood makes breathing worse. SOB has been worse since having Covid in august. Oxygen levels have been dropping since. Ct completed at [facility] showed scarring.   Using an inhaler in morning and night but unsure of the name. Using albuterol inhaler 3x/ week.  Dyspnea:  Across any parking lot. Marland Kitchen MMRC3 = can't walk 100 yards even at a slow pace at a flat grade s stopping due to sob      Cough: not at all  Sleep: ambien sleeps on side / bed is flat one pillow SABA use: proair may help some receent addition of lasix resolved his leg swelling and helped breathing some as well    No obvious day to day or daytime variability or assoc excess/ purulent sputum or mucus plugs or hemoptysis or cp or chest tightness, subjective wheeze or overt sinus or hb symptoms.   Sleeping as above  without nocturnal  or  early am exacerbation  of respiratory  c/o's or need for noct saba. Also denies any obvious fluctuation of symptoms with weather or environmental changes or other aggravating or alleviating factors except as outlined above   No unusual exposure hx or h/o childhood pna/ asthma or knowledge of premature birth.  Current Allergies, Complete Past Medical History, Past Surgical History, Family History, and Social History were reviewed in [employer] record.  ROS  The following are not active complaints unless bolded Hoarseness, sore throat, dysphagia, dental problems, itching, sneezing,  nasal congestion or discharge of excess mucus or purulent secretions, ear ache,   fever, chills, sweats, unintended wt loss or wt gain, classically pleuritic or exertional cp,  orthopnea pnd or arm/hand swelling  or leg swelling, presyncope, palpitations, abdominal pain, anorexia, nausea, vomiting, diarrhea  or change in bowel habits or change in bladder habits, change in stools or change in urine, dysuria, hematuria,  rash, arthralgias, visual complaints, headache, numbness, weakness or ataxia or problems with walking or coordination,  change in mood or  memory.             No past medical history on file.  Outpatient Medications Prior to Visit - - NOTE:   Unable to verify as accurately reflecting what pt takes  - also on lasix daily  Medication Sig Dispense Refill  . albuterol (VENTOLIN HFA) 108 (90 Base) MCG/ACT inhaler Inhale into the lungs.    Marland Kitchen atorvastatin (LIPITOR) 40 MG tablet Take by mouth.    . flecainide (TAMBOCOR) 50 MG tablet Take 1 tablet (50 mg total) by mouth 2 (two) times daily. Needs ov for further refills 62 tablet 2  . levothyroxine (SYNTHROID) 175 MCG tablet Take 1 tablet by mouth daily.    Marland Kitchen ipratropium-albuterol (DUONEB) 0.5-2.5 (3) MG/3ML SOLN INHALE 1 VIAL IN NEBULIZER BY MOUTH EVERY FOUR HOURS AS NEEDED (Patient not taking: Reported on 12/13/2020)     No  facility-administered medications prior to visit.     Objective:     BP 116/76 (BP Location: Left Arm, Cuff Size: Normal)   Pulse 60   Temp 98 F (36.7 C)   Ht 5\' 7"  (1.702 m)   Wt 213 lb (96.6 kg)   SpO2 94%   BMI 33.36 kg/m   SpO2: 94 %   General appearance:    Pleasant very hoarse obese amb wm nad     Obese pleasant amb wm nad   HEENT : pt wearing mask not removed for exam due to covid -19 concerns.    NECK :  without JVD/Nodes/TM/ nl carotid upstrokes bilaterally   LUNGS: no acc muscle use,  Nl contour chest which is clear to A and P bilaterally without cough on insp or exp maneuvers   CV:  RRR  no s3 or murmur or increase in P2, and no edema   ABD:  Obese soft and nontender with nl inspiratory excursion in the supine position. No bruits or organomegaly appreciated, bowel sounds nl  MS:  Nl gait/ ext warm without deformities, calf tenderness, cyanosis or clubbing No obvious joint restrictions   SKIN: warm and dry without lesions    NEURO:  alert, approp, nl sensorium with  no motor or cerebellar deficits apparent.    CXR PA and Lateral:   12/13/2020 :    I personally reviewed images and agree with radiology impression as follows:   Low lung volumes without evidence of acute cardiopulmonary disease     Labs ordered/ reviewed:      Chemistry      Component Value Date/Time   NA 143 12/13/2020 1128   K 4.3 12/13/2020 1128   CL 99 12/13/2020 1128   CO2 38 (H) 12/13/2020 1128   BUN 31 (H) 12/13/2020 1128   CREATININE 1.18 12/13/2020 1128      Component Value Date/Time   CALCIUM 8.9 12/13/2020 1128   ALKPHOS 75 11/24/2008 1745   AST 41 (H) 11/24/2008 1745   ALT 32 11/24/2008 1745   BILITOT 0.7 11/24/2008 1745        Lab Results  Component Value Date   WBC 5.6 12/13/2020   HGB 16.0 12/13/2020   HCT 49.2 12/13/2020   MCV 90.2 12/13/2020   PLT 177.0 12/13/2020       EOS                                                               0.2  12/13/2020   Lab Results  Component Value Date   DDIMER 0.28 12/13/2020      Lab Results  Component Value Date   TSH 1.41 12/13/2020     Lab Results  Component Value Date   PROBNP 140.0 (H) 12/13/2020       Lab Results  Component Value Date   ESRSEDRATE 8 12/13/2020            Assessment   DOE (dyspnea on exertion) Onset was with covid summer of 2021  Echo 12/08/20 LeftVentricle: Systolic function is normal. EF: 60-65%.  . LeftVentricle: No regional wall motion abnormalities noted.  Marland Kitchen LeftVentricle: Doppler parameters consistent with restrictive filling  pattern and markedly elevated LA pressure.  . LeftAtrium: Left atrium is moderately dilated.   . AorticValve: Trace aortic valve regurgitation.  . MitralValve: There is moderate posterior annular calcification.  . MitralValve: There is mildto moderate regurgitation with a centrally  directed jet. - HRCT chest 11/08/19  Neg ILD  -  12/13/2020   Walked RA  3 laps @ approx 260ft each @ moderately fast  pace  stopped due to end of study with sats still 93%  - 12/13/2020  After extensive coaching inhaler device,  effectiveness =    50% > continue symbicort 160 one bid pending pfts on return   Symptoms are markedly disproportionate to objective findings and not clear to what extent this is actually a pulmonary  problem but pt does appear to have difficult to sort out respiratory symptoms of unknown origin for which  DDX  = almost all start with A and  include Adherence, Ace Inhibitors, Acid Reflux, Active Sinus Disease, Alpha 1 Antitripsin deficiency, Anxiety masquerading as Airways dz,  ABPA,  Allergy(esp in young), Aspiration (esp in elderly), Adverse effects of meds,  Active smoking or Vaping, A bunch of PE's/clot burden (a few small clots can't cause this syndrome unless there is already severe underlying pulm or vascular dz with poor reserve),  Anemia or thyroid disorder, plus two Bs  =  Bronchiectasis and Beta blocker use..and one C= CHF   Adherence is always the initial "prime suspect" and is a multilayered concern that requires a "trust but verify" approach in every patient - starting with knowing how to use medications, especially inhalers, correctly, keeping up with refills and understanding the fundamental difference between maintenance and prns vs those medications only taken for a very short course and then stopped and not refilled.  - see hfa teaching - with all meds in hand using a trust but verify approach to confirm accurate Medication  Reconciliation The principal here is that until we are certain that the  patients are doing what we've asked, it makes no sense to ask them to do more.   Allergy/asthma > report of reversible airflow on pfts but they are mostly restrictive so asthma component likely mild or could be "cardiac asthma"  > just continue symbicort 160 one bid for now pending f/u pfts but no symbicort day of pfts   Anemia/ thyroid dz > ruled out today   ? Anxiety/depression/ deconditioning  > usually at the bottom of this list of usual suspects but   may interfere with adherence and also interpretation of response or lack thereof to symptom management which can be quite subjective.  - encouraged to start regular paced sub max ex building up to a min of a total of 30 min daily   ? A bunch of PEs > D dimer nl -  while a normal  or high normal value (seen commonly in the elderly or chronically ill)  may miss small peripheral pe, the clot burden with sob is moderately high and the d dimer  has a very high neg pred value if used in this setting.    ? chf > had leg swelling which resolved with lasix and BNP in intermediate range so could have component of CHF/ MR contributing  but is probably over diuresed at present so should just take lasix prn leg swelling at this point    F/u with pfts   Each maintenance medication was reviewed in detail including emphasizing  most importantly the difference between maintenance and prns and under what circumstances the prns are to be triggered using an action plan format where appropriate.  Total time for H and P, chart review including extensive records in care everywhere from multiple sources, counseling, reviewing hfa device(s) , directly observing portions of ambulatory 02 saturation study/ and generating customized AVS unique to this office visit / same day charting = 60 min             Christinia Gully, MD 12/14/2020

## 2020-12-14 ENCOUNTER — Encounter: Payer: Self-pay | Admitting: Internal Medicine

## 2020-12-16 NOTE — Progress Notes (Signed)
Spoke with pt and notified of results per Dr. Wert. Pt verbalized understanding and denied any questions. 

## 2020-12-17 ENCOUNTER — Encounter: Payer: Self-pay | Admitting: *Deleted

## 2020-12-19 LAB — D-DIMER, QUANTITATIVE: D-Dimer, Quant: 0.28 mcg/mL FEU (ref <0.50)

## 2020-12-19 LAB — IGE

## 2020-12-20 ENCOUNTER — Other Ambulatory Visit: Payer: Self-pay | Admitting: Internal Medicine

## 2020-12-20 DIAGNOSIS — R0609 Other forms of dyspnea: Secondary | ICD-10-CM

## 2020-12-20 DIAGNOSIS — R0600 Dyspnea, unspecified: Secondary | ICD-10-CM

## 2021-02-05 ENCOUNTER — Encounter: Payer: Self-pay | Admitting: Internal Medicine

## 2021-02-05 ENCOUNTER — Ambulatory Visit (INDEPENDENT_AMBULATORY_CARE_PROVIDER_SITE_OTHER): Payer: No Typology Code available for payment source | Admitting: Internal Medicine

## 2021-02-05 ENCOUNTER — Other Ambulatory Visit: Payer: Self-pay

## 2021-02-05 DIAGNOSIS — R0600 Dyspnea, unspecified: Secondary | ICD-10-CM

## 2021-02-05 DIAGNOSIS — R0609 Other forms of dyspnea: Secondary | ICD-10-CM

## 2021-02-05 LAB — PULMONARY FUNCTION TEST
DL/VA % pred: 127 %
DL/VA: 5.63 ml/min/mmHg/L
DLCO cor % pred: 63 %
DLCO cor: 17.98 ml/min/mmHg
DLCO unc % pred: 65 %
DLCO unc: 18.65 ml/min/mmHg
FEF 25-75 Post: 1.98 L/sec
FEF 25-75 Pre: 1.31 L/sec
FEF2575-%Change-Post: 51 %
FEF2575-%Pred-Post: 92 %
FEF2575-%Pred-Pre: 60 %
FEV1-%Change-Post: 8 %
FEV1-%Pred-Post: 49 %
FEV1-%Pred-Pre: 45 %
FEV1-Post: 1.39 L
FEV1-Pre: 1.27 L
FEV1FVC-%Change-Post: 4 %
FEV1FVC-%Pred-Pre: 108 %
FEV6-%Change-Post: 4 %
FEV6-%Pred-Post: 45 %
FEV6-%Pred-Pre: 43 %
FEV6-Post: 1.62 L
FEV6-Pre: 1.55 L
FEV6FVC-%Pred-Post: 104 %
FEV6FVC-%Pred-Pre: 104 %
FVC-%Change-Post: 4 %
FVC-%Pred-Post: 43 %
FVC-%Pred-Pre: 41 %
FVC-Post: 1.62 L
FVC-Pre: 1.55 L
Post FEV1/FVC ratio: 86 %
Post FEV6/FVC ratio: 100 %
Pre FEV1/FVC ratio: 82 %
Pre FEV6/FVC Ratio: 100 %
RV % pred: 40 %
RV: 0.96 L
TLC % pred: 45 %
TLC: 2.93 L

## 2021-02-05 NOTE — Progress Notes (Signed)
Full PFT performed today. °

## 2021-02-05 NOTE — Patient Instructions (Signed)
Full PFT performed today. °

## 2021-02-05 NOTE — Assessment & Plan Note (Signed)
Onset was with covid summer of 2021  Echo 12/08/20 LeftVentricle: Systolic function is normal. EF: 60-65%.  . LeftVentricle: No regional wall motion abnormalities noted.  Marland Kitchen LeftVentricle: Doppler parameters consistent with restrictive filling  pattern and markedly elevated LA pressure.  . LeftAtrium: Left atrium is moderately dilated.   . AorticValve: Trace aortic valve regurgitation.  . MitralValve: There is moderate posterior annular calcification.  . MitralValve: There is mildto moderate regurgitation with a centrally  directed jet. - HRCT chest 11/08/19  Neg ILD  -  12/13/2020   Walked RA  3 laps @ approx 264ft each @ moderately fast  pace  stopped due to end of study with sats still 93%  - 12/13/2020  After extensive coaching inhaler device,  effectiveness =    50% > continue symbicort 160 one bid pending pfts on return  > [facility] wixella [facility] but no airflow obst 02/05/2021 off inhalers so rec taper this off and see if symptoms recur - PFT's  02/05/2021  FEV1 1.39 % ) ratio 0.86  p 8 % improvement from saba p 0 prior to study with DLCO  18.65 (65%)   FV curve minimally concave    Pattern is restrictive post covid but clinically ? Better p rx with wixella > since hoarseness is main ongoing concern rec  1) taper off wixella, resme if breathing worsens or start noting need for saba  2) ENT f/u per pcp  Pulmonary f/u prn          Each maintenance medication was reviewed in detail including emphasizing most importantly the difference between maintenance and prns and under what circumstances the prns are to be triggered using an action plan format where appropriate.  Total time for H and P, chart review, counseling, reviewing dpi/ hfa device(s) and generating customized AVS unique to this summary final office visit / same day charting  > 30 min

## 2021-02-05 NOTE — Patient Instructions (Addendum)
You will need follow by ENT for your hoarseness - defer this to your [facility] PCP   Try wixella just in am for a couple of weeks then stop completely and resume if cough or breathing get worse or start needing saba   To get the most out of exercise, you need to be continuously aware that you are short of breath, but never out of breath, for at least 30 minutes daily. As you improve, it will actually be easier for you to do the same amount of exercise  in  30 minutes so always push to the level where you are short of breath.  Once you can do this, push for longer duration or repeat it after at least 4 hours of rest.  Make sure you check your oxygen saturations at highest level of activity   Pulmonary follow up is as needed

## 2021-02-05 NOTE — Progress Notes (Signed)
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 2008 at [facility] and hoarse ever since (never told about a paralyzed cord) and retired from [employer] 2014 at wt around 200-210  with new onset recurrent bronchitis typically each December since retired rx pred/abx then covid Aug 2021 admitted with covid x 4 days, did not require 02  and not back to baseline so rx symbicort 160 on 1 bid then acute flare in nov 2021 resulted in low 02 req hosp for 1st time > left hosp and did about the same until [place] trip mid March 2022 > readmitted at [facility] > d/c off 02 and has neb but rarely needs so referred to pulmonary clinic 12/13/2020 by [facility] for second opinion with prior pfts by Dr Tilden Dome showing "severe restriction with response to bronchodilator"   History of Present Illness  12/13/2020  Pulmonary/ 1st office eval/Barry Escobar - 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 Consult    Sob started 1 year ago after having Bronchitis. Working in Engineer, site wood makes breathing worse. SOB has been worse since having Covid in august. Oxygen levels have been dropping since. Ct completed at [place] showed scarring.   Using an inhaler in morning and night but unsure of the name. Using albuterol inhaler 3x/ week.  Dyspnea:  Across any parking lot. Marland Kitchen MMRC3 = can't walk 100 yards even at a slow pace at a flat grade s stopping due to sob      Cough: not at all  Sleep: ambien sleeps on side / bed is flat one pillow SABA use: proair may help some recent addition of lasix resolved his leg swelling and helped breathing some as well   rec Continue symbicort 160  for now one twice daily  Work on inhaler technique:     Please schedule a follow up office visit in 6 weeks, call sooner if needed  PFT's on return     02/05/2021  f/u ov/Barry Escobar re: doe much better,  pfts today off rx show no airflow obst  Chief Complaint  Patient presents with  . Follow-up    PFT's done today. Breathing is much improved since the last visit. He has not used his albuterol inhaler recently.    Dyspnea:  No regular exercise/ cannot name one activity other than carrying heavy objects up steps that now make him sob  Cough: none  Sleeping: ambien and sleeps fine  SABA use: none / wixella one bid [facility]  02: none   Covid status:   vax x 2 and had covid    No obvious day to day or daytime variability or assoc excess/ purulent sputum or mucus plugs or hemoptysis or cp or chest tightness, subjective wheeze or overt sinus or hb symptoms.   Sleeping  without nocturnal  or early am exacerbation  of respiratory  c/o's or need for noct saba. Also denies any obvious fluctuation of symptoms with weather or environmental changes or other aggravating or alleviating factors except as outlined above   No unusual exposure hx or h/o childhood pna/ asthma or knowledge of premature birth.  Current Allergies, Complete Past Medical History, Past Surgical History, Family History, and Social History were reviewed in [employer] record.  ROS  The following are not active complaints unless bolded Hoarseness, sore throat, dysphagia, dental problems, itching, sneezing,  nasal congestion or discharge of excess mucus or purulent secretions, ear ache,   fever, chills, sweats, unintended wt loss or wt gain, classically pleuritic or exertional cp,  orthopnea pnd or arm/hand swelling  or leg swelling, presyncope, palpitations, abdominal pain, anorexia, nausea, vomiting, diarrhea  or change in bowel habits or change in bladder habits, change in stools or change in urine, dysuria, hematuria,  rash, arthralgias, visual complaints, headache, numbness, weakness or ataxia or problems with walking or coordination,  change in mood or  memory.        Current Meds   Medication Sig  . albuterol (VENTOLIN HFA) 108 (90 Base) MCG/ACT inhaler Inhale into the lungs.  Marland Kitchen atorvastatin (LIPITOR) 40 MG tablet Take by mouth.  . flecainide (TAMBOCOR) 50 MG tablet Take 1 tablet (50 mg total) by mouth 2 (two) times daily. Needs ov for further refills  . Fluticasone-Salmeterol (WIXELA INHUB IN) Inhale 1 puff into the lungs in the morning and at bedtime.  Marland Kitchen levothyroxine (SYNTHROID) 175 MCG tablet Take 1 tablet by mouth daily.           Objective:     Wt Readings from Last 3 Encounters:  02/05/21 218 lb (98.9 kg)  12/13/20 213 lb (96.6 kg)      Vital signs reviewed  02/05/2021  - Note at rest 02 sats  94% on RA   General appearance:    Very hoarse obese amb wm nad    HEENT : pt wearing mask not removed for exam due to covid -19 concerns.    NECK :  without JVD/Nodes/TM/ nl carotid upstrokes bilaterally   LUNGS: no acc muscle use,  Nl contour chest w slt reduced bs bases  bilaterally without cough on insp or exp maneuvers   CV:  RRR  no s3 or murmur or increase in P2, and trace bilateral ankle edema   ABD:  Obese soft and nontender with nl inspiratory excursion in the supine position. No bruits or organomegaly appreciated, bowel sounds nl  MS:  Nl gait/ ext warm without deformities, calf tenderness, cyanosis or clubbing No obvious joint restrictions   SKIN: warm and dry without lesions    NEURO:  alert, approp, nl sensorium with  no motor or cerebellar deficits apparent.                     Assessment

## 2021-03-26 ENCOUNTER — Other Ambulatory Visit: Payer: Self-pay

## 2021-03-26 ENCOUNTER — Emergency Department (HOSPITAL_COMMUNITY)
Admission: EM | Admit: 2021-03-26 | Discharge: 2021-03-27 | Disposition: A | Discharge status: Home / Self Care | Payer: No Typology Code available for payment source | Attending: Emergency Medicine | Admitting: Emergency Medicine

## 2021-03-26 ENCOUNTER — Encounter (HOSPITAL_COMMUNITY): Payer: Self-pay | Admitting: Emergency Medicine

## 2021-03-26 ENCOUNTER — Emergency Department (HOSPITAL_COMMUNITY): Payer: No Typology Code available for payment source

## 2021-03-26 DIAGNOSIS — R0602 Shortness of breath: Secondary | ICD-10-CM | POA: Diagnosis present

## 2021-03-26 DIAGNOSIS — Z8546 Personal history of malignant neoplasm of prostate: Secondary | ICD-10-CM | POA: Insufficient documentation

## 2021-03-26 DIAGNOSIS — Z79899 Other long term (current) drug therapy: Secondary | ICD-10-CM | POA: Insufficient documentation

## 2021-03-26 DIAGNOSIS — Z85850 Personal history of malignant neoplasm of thyroid: Secondary | ICD-10-CM | POA: Diagnosis not present

## 2021-03-26 DIAGNOSIS — I509 Heart failure, unspecified: Secondary | ICD-10-CM | POA: Insufficient documentation

## 2021-03-26 DIAGNOSIS — R079 Chest pain, unspecified: Secondary | ICD-10-CM | POA: Insufficient documentation

## 2021-03-26 DIAGNOSIS — I110 Hypertensive heart disease with heart failure: Secondary | ICD-10-CM | POA: Diagnosis not present

## 2021-03-26 DIAGNOSIS — Z8616 Personal history of COVID-19: Secondary | ICD-10-CM | POA: Insufficient documentation

## 2021-03-26 HISTORY — DX: Malignant neoplasm of prostate: C61

## 2021-03-26 HISTORY — DX: Essential (primary) hypertension: I10

## 2021-03-26 HISTORY — DX: Malignant neoplasm of thyroid gland: C73

## 2021-03-26 HISTORY — DX: Heart failure, unspecified: I50.9

## 2021-03-26 HISTORY — DX: COVID-19: U07.1

## 2021-03-26 LAB — BRAIN NATRIURETIC PEPTIDE: B Natriuretic Peptide: 331.4 pg/mL — ABNORMAL HIGH (ref 0.0–100.0)

## 2021-03-26 LAB — BASIC METABOLIC PANEL
Anion gap: 8 (ref 5–15)
BUN: 24 mg/dL — ABNORMAL HIGH (ref 8–23)
CO2: 32 mmol/L (ref 22–32)
Calcium: 8.5 mg/dL — ABNORMAL LOW (ref 8.9–10.3)
Chloride: 100 mmol/L (ref 98–111)
Creatinine, Ser: 0.94 mg/dL (ref 0.61–1.24)
GFR, Estimated: >60 mL/min (ref >60)
Glucose, Bld: 92 mg/dL (ref 70–99)
Potassium: 4.2 mmol/L (ref 3.5–5.1)
Sodium: 140 mmol/L (ref 135–145)

## 2021-03-26 LAB — CBC
HCT: 45.4 % (ref 39.0–52.0)
Hemoglobin: 14.8 g/dL (ref 13.0–17.0)
MCH: 31.4 pg (ref 26.0–34.0)
MCHC: 32.6 g/dL (ref 30.0–36.0)
MCV: 96.2 fL (ref 80.0–100.0)
Platelets: 166 10*3/uL (ref 150–400)
RBC: 4.72 MIL/uL (ref 4.22–5.81)
RDW: 14.8 % (ref 11.5–15.5)
WBC: 5.5 10*3/uL (ref 4.0–10.5)
nRBC: 0 % (ref 0.0–0.2)

## 2021-03-26 LAB — TROPONIN I (HIGH SENSITIVITY)
Troponin I (High Sensitivity): 12 ng/L (ref <18)
Troponin I (High Sensitivity): 12 ng/L (ref <18)

## 2021-03-26 NOTE — ED Provider Notes (Signed)
Emergency Medicine Provider Triage Evaluation Note  Barry Escobar , a 75 y.o. male  was evaluated in triage.  Pt complains of chest pain, shortness of breath, with worsening pedal edema.  Patient states the chest pain is in the middle of his chest, does not radiate, not associated with diaphoretic, nausea or vomiting, states that chest pains are worsened with movement.  He does endorse that he feels more winded on exertion.  States he has been taking his Lasix as prescribed, but still has pedal edema. Review of Systems  Positive: Shortness of breath, chest pain Negative: Abdominal pain, nausea  Physical Exam  BP (!) 190/94 (BP Location: Right Arm)   Pulse 63   Temp 98.4 F (36.9 C) (Oral)   Resp 18   Ht 5\' 7"  (1.702 m)   Wt 97.5 kg   SpO2 94%   BMI 33.67 kg/m  Gen:   Awake, no distress   Resp:  Normal effort  MSK:   Moves extremities without difficulty  Other:    Medical Decision Making  Medically screening exam initiated at 7:25 PM.  Appropriate orders placed.  Barry Escobar was informed that the remainder of the evaluation will be completed by another provider, this initial triage assessment does not replace that evaluation, and the importance of remaining in the ED until their evaluation is complete.  Presents with shortness of breath and chest pain lab work and imaging have been ordered, patient will need further work-up.   Marcello Fennel, PA-C 03/26/21 Orland Jarred, MD 03/26/21 [phone]

## 2021-03-26 NOTE — ED Triage Notes (Signed)
Pt reports sob with exertion for the past month and then central chest tightness without radiation that started today. Pt reports hx of CHF and htn, compliant with medications.

## 2021-03-27 NOTE — ED Notes (Signed)
Dr. Christy Gentles informed that pt would like to leave. Pt has taken off all monitoring devices.

## 2021-03-27 NOTE — ED Notes (Signed)
Pt stated that he is leaving. Pt also states " They have all the results, I'll just call back later to schedule an appointment".

## 2021-03-27 NOTE — ED Provider Notes (Signed)
[facility] EMERGENCY DEPARTMENT Provider Note   CSN: [id number] Arrival date & time: 03/26/21  1859     History Chief Complaint  Patient presents with   Shortness of Breath   Chest Pain    Barry Escobar is a 75 y.o. male.  The history is provided by the patient.  Shortness of Breath Severity:  Moderate Onset quality:  Gradual Timing:  Intermittent Progression:  Worsening Chronicity:  Chronic Relieved by:  Rest Worsened by:  Activity Associated symptoms: no chest pain   Chest Pain Associated symptoms: shortness of breath   Patient presents with shortness of breath.  He reports he has had this ongoing for some time.  He reports it is worse with exertion.  No fevers or vomiting.  He denies any chest pain on my evaluation.  He reports he has been seen by a specialist but is unclear what is causing his shortness of breath.  He is now requesting discharge    Past Medical History:  Diagnosis Date   CHF (congestive heart failure) ([place])    COVID-19    Hypertension    Prostate cancer ([place])    Thyroid cancer [facility])     Patient Active Problem List   Diagnosis Date Noted   DOE (dyspnea on exertion) 12/13/2020   CHEST PAIN UNSPECIFIED 04/30/2009   ALCOHOL USE 01/22/2009   THYROID CANCER 01/18/2009   HYPERLIPIDEMIA 01/18/2009   ATRIAL FIBRILLATION, PAROXYSMAL 01/18/2009    History reviewed. No pertinent surgical history.     No family history on file.  Social History   Tobacco Use   Smoking status: Never   Smokeless tobacco: Never  Substance Use Topics   Alcohol use: Yes    Alcohol/week: 3.0 standard drinks    Types: 3 Glasses of wine per week   Drug use: Never    Home Medications Prior to Admission medications   Medication Sig Start Date End Date Taking? Authorizing Provider  albuterol (VENTOLIN HFA) 108 (90 Base) MCG/ACT inhaler Inhale into the lungs. 10/28/16   [provider]  atorvastatin (LIPITOR) 40 MG tablet Take by  mouth.    [provider]  flecainide (TAMBOCOR) 50 MG tablet Take 1 tablet (50 mg total) by mouth 2 (two) times daily. Needs ov for further refills 05/25/12   Bensimhon, Shaune Pascal, MD  Fluticasone-Salmeterol (WIXELA INHUB IN) Inhale 1 puff into the lungs in the morning and at bedtime.    [provider]  levothyroxine (SYNTHROID) 175 MCG tablet Take 1 tablet by mouth daily. 11/20/19   [provider]    Allergies    Patient has no known allergies.  Review of Systems   Review of Systems  Respiratory:  Positive for shortness of breath.   Cardiovascular:  Positive for leg swelling. Negative for chest pain.  All other systems reviewed and are negative.  Physical Exam Updated Vital Signs BP (!) 195/92   Pulse 65   Temp 98.4 F (36.9 C) (Oral)   Resp (!) 9   Ht 1.702 m (5\' 7" )   Wt 97.5 kg   SpO2 92%   BMI 33.67 kg/m   Physical Exam CONSTITUTIONAL: Well developed/well nourished HEAD: Normocephalic/atraumatic EYES: EOMI/PERRL ENMT: Mucous membranes moist NECK: supple no meningeal signs, no JVD SPINE/BACK:entire spine nontender CV: S1/S2 noted, no murmurs/rubs/gallops noted LUNGS: Lungs are clear to auscultation bilaterally, no apparent distress ABDOMEN: soft, nontender, no rebound or guarding, bowel sounds noted throughout abdomen GU:no cva tenderness NEURO: Pt is awake/alert/appropriate, moves all  extremitiesx4.  No facial droop.   EXTREMITIES: pulses normal/equal, full ROM, minimal edema to bilateral lower extremities, no calf tenderness or erythema SKIN: warm, color normal PSYCH: no abnormalities of mood noted, alert and oriented to situation  ED Results / Procedures / Treatments   Labs (all labs ordered are listed, but only abnormal results are displayed) Labs Reviewed  BASIC METABOLIC PANEL - Abnormal; Notable for the following components:      Result Value   BUN 24 (*)    Calcium 8.5 (*)    All other components within normal limits  BRAIN  NATRIURETIC PEPTIDE - Abnormal; Notable for the following components:   B Natriuretic Peptide 331.4 (*)    All other components within normal limits  CBC  TROPONIN I (HIGH SENSITIVITY)  TROPONIN I (HIGH SENSITIVITY)    EKG EKG Interpretation  Date/Time:  Wednesday March 26 2021 19:14:33 EDT Ventricular Rate:  63 PR Interval:  142 QRS Duration: 106 QT Interval:  416 QTC Calculation: 425 R Axis:   -22 Text Interpretation: Normal sinus rhythm Minimal voltage criteria for LVH, may be normal variant ( R in aVL ) Borderline ECG Confirmed by Ripley Fraise [phone]) on 03/27/2021 12:32:40 AM  Radiology DG Chest 2 View  Result Date: 03/26/2021 CLINICAL DATA:  75 year old male with chest pain EXAM: CHEST - 2 VIEW COMPARISON:  Chest radiograph dated 12/13/2020. FINDINGS: There are bibasilar streaky atelectasis. No focal consolidation, pleural effusion pneumothorax. Stable mild cardiomegaly. Atherosclerotic calcification of the aortic arch. No acute osseous pathology. Degenerative changes of the spine. Fixation screw in the right humeral head. IMPRESSION: No active cardiopulmonary disease. Electronically Signed   By: Anner Crete M.D.   On: 03/26/2021 19:57    Procedures Procedures   Medications Ordered in ED Medications - No data to display  ED Course  I have reviewed the triage vital signs and the nursing notes.  Pertinent labs & imaging results that were available during my care of the patient were reviewed by me and considered in my medical decision making (see chart for details).    MDM Rules/Calculators/A&P                          Patient presents with shortness of breath.  He admits that this is been ongoing for quite some time.  He reports he is unclear what is causing his shortness of breath and his family are calling him a "hypochondriac" He reports dyspnea on exertion for quite some time.  He denies orthopnea.  He had mentioned to nursing staff he had chest pain, but when  I interviewed patient he denied it. When I review his chart he has been seen by pulmonology and there was some thought that his symptoms could be post-COVID related Patient is a never smoker There may also be a component of heart failure. Patient reports his friends brought him to this hospital because he was told that we have cardiology here.  Patient then tells me that he has a cardiologist  in [place] has follow-up in September.  Patient is requesting cardiology referral today. Patient is mildly tachypneic at times, but overall in no acute distress.  Overall lung sounds are clear.  His pulse ox is ranged in the mid to low 90s, but this is been seen on previous office visit This appears to be more of a chronic issue.  Patient is now requesting discharge Cardiology referral given  Final Clinical Impression(s) / ED Diagnoses Final  diagnoses:  Shortness of breath    Rx / DC Orders ED Discharge Orders     None        Ripley Fraise, MD 03/27/21 [phone]

## 2021-04-04 ENCOUNTER — Telehealth: Payer: Self-pay | Admitting: Emergency Medicine

## 2021-04-04 NOTE — Telephone Encounter (Signed)
Barry Escobar here in person - identifiers confirmed - pt seen in [facility] ED recently- current copy of discharge AVS is faded (low toner). Pt is requesting a better copy for him to give to the [place]. Pt lives in [place] & did not want to drive back to [place]. Pt has not set My Chart. RN instructed Barry Escobar on how to set up My Chart & printed a clearer copy of AVS for Barry Escobar

## 2021-06-04 NOTE — Progress Notes (Signed)
Referring-Barry Wickline MD Reason for referral-CP and dyspnea  HPI: 75 yo male for evaluation of dyspnea and CP at request of Barry Fraise MD. Previously seen by Barry Escobar but not since 05/08/09. Pt with h/o PAF s/p ablation 2015.  He had a cardiac catheterization 2010 revealing normal arteries by report.  Patient has had progressive dyspnea since COVID April 2021.  Pulmonary function tests in the past apparently have shown severe restrictive impairment but normal DLCO.  Apparently CTs in the past have not demonstrated interstitial lung disease.  Echocardiogram March 2022 showed normal LV function, moderate left atrial enlargement, trace aortic insufficiency, mild to moderate mitral regurgitation and negative saline microcavitation study.  Patient seen recently in the emergency room with dyspnea.  Hemoglobin 14.8, troponins normal, creatinine 1.29, BNP 331, chest x-ray with no acute infiltrates.  Nuclear study at [facility] September 2022 showed no ischemia, diaphragmatic attenuation and ejection fraction 78%.  Cardiology now asked to evaluate.  Note patient has also been seen by Barry. Barry Escobar in Barry Escobar.  He describes progressive significant dyspnea on exertion but no chest pain.  Question orthopnea.  He has mild ankle edema at times for which he takes Lasix.  He denies palpitations.  No fevers, chills or productive cough.  Note at time of arrival patient was severely short of breath with activities.  His initial saturation was 85% which did not improve.  Current Outpatient Medications  Medication Sig Dispense Refill   albuterol (VENTOLIN HFA) 108 (90 Base) MCG/ACT inhaler Inhale into the lungs.     atorvastatin (LIPITOR) 40 MG tablet Take by mouth.     flecainide (TAMBOCOR) 50 MG tablet Take 1 tablet (50 mg total) by mouth 2 (two) times daily. Needs ov for further refills 62 tablet 2   Fluticasone-Salmeterol (WIXELA INHUB IN) Inhale 1 puff into the lungs in the morning and at bedtime.      levothyroxine (SYNTHROID) 175 MCG tablet Take 1 tablet by mouth daily.     No current facility-administered medications for this visit.    No Known Allergies   Past Medical History:  Diagnosis Date   Asthma    CHF (congestive heart failure) (HCC)    COVID-19    Hyperlipidemia    Hypertension    PAF (paroxysmal atrial fibrillation) (H[facility])    Prostate cancer ([place])    Thyroid cancer ([place])     Past Surgical History:  Procedure Laterality Date   KNEE SURGERY     PROSTATECTOMY     THYROIDECTOMY      Social History   Socioeconomic History   Marital status: Divorced    Spouse name: Not on file   Number of children: 2   Years of education: Not on file   Highest education level: Not on file  Occupational History   Not on file  Tobacco Use   Smoking status: Never   Smokeless tobacco: Never  Substance and Sexual Activity   Alcohol use: Yes    Alcohol/week: 3.0 standard drinks    Types: 3 Glasses of wine per week    Comment: 3 drinks per day   Drug use: Never   Sexual activity: Not on file  Other Topics Concern   Not on file  Social History Narrative   Not on file   Social Determinants of Health   Financial Resource Strain: Not on file  Food Insecurity: Not on file  Transportation Needs: Not on file  Physical Activity: Not on file  Stress: Not on file  Social Connections: Not on file  Intimate Partner Violence: Not on file    Family History  Problem Relation Age of Onset   Cancer Mother    Cancer Father     ROS: no fevers or chills, productive cough, hemoptysis, dysphasia, odynophagia, melena, hematochezia, dysuria, hematuria, rash, seizure activity, orthopnea, PND, pedal edema, claudication. Remaining systems are negative.  Physical Exam:   Blood pressure (!) 145/80, pulse 85, height 5\' 7"  (1.702 m), weight 224 lb 1.9 oz (101.7 kg), SpO2 (!) 86 %.  General:  Well developed/well nourished in NAD Skin warm/dry Patient not depressed No peripheral  clubbing Back-normal HEENT-normal/normal eyelids Neck supple/normal carotid upstroke bilaterally; no bruits; no JVD; no thyromegaly chest - CTA/ normal expansion CV - RRR/normal S1 and S2; no rubs or gallops;  PMI nondisplaced; 1/6 systolic murmur apex Abdomen -NT/ND, no HSM, no mass, + bowel sounds, no bruit 2+ femoral pulses, no bruits Ext-trace edema, no chords, 2+ DP Neuro-grossly nonfocal  ECG - 03/26/21 sinus rhythm, LVH; personally reviewed  A/P  1 dyspnea/hypoxia-etiology unclear.  He is not particularly volume overloaded on examination.  He has severe dyspnea on exertion but is also hypoxic at rest.  This seems more likely to be a pulmonary issue.  Note his previous echocardiogram showed preserved LV function though apparently he has had diastolic dysfunction in the past.  Recent nuclear study showed no ischemia.  Given his resting saturation of 85% we placed the patient on oxygen.  We will have him seen in the emergency room.  We were also able to contact Barry. [name] office and he is scheduled for an evaluation later today at 2:30.  However he may need to be admitted pending on further evaluation in the emergency room.  2 history of atrial fibrillation-patient is status post ablation and is maintained on flecainide.  We will continue.  Note he has had no recurrences.  3 hypertension-blood pressure controlled.  Continue present medications.  4 hyperlipidemia-continue statin.  Approximately 60 minutes spent in patient care in the office today.  Barry Ruths, MD

## 2021-06-09 ENCOUNTER — Ambulatory Visit: Payer: No Typology Code available for payment source | Admitting: Internal Medicine

## 2021-06-09 ENCOUNTER — Ambulatory Visit (INDEPENDENT_AMBULATORY_CARE_PROVIDER_SITE_OTHER): Payer: Medicare HMO | Admitting: Cardiology

## 2021-06-09 ENCOUNTER — Other Ambulatory Visit: Payer: Self-pay

## 2021-06-09 ENCOUNTER — Encounter: Payer: Self-pay | Admitting: Cardiology

## 2021-06-09 VITALS — BP 145/80 | HR 85 | Ht 67.0 in | Wt 224.1 lb

## 2021-06-09 DIAGNOSIS — R0600 Dyspnea, unspecified: Secondary | ICD-10-CM

## 2021-06-09 DIAGNOSIS — E7800 Pure hypercholesterolemia, unspecified: Secondary | ICD-10-CM | POA: Diagnosis not present

## 2021-06-09 DIAGNOSIS — R0609 Other forms of dyspnea: Secondary | ICD-10-CM

## 2021-06-09 DIAGNOSIS — I10 Essential (primary) hypertension: Secondary | ICD-10-CM | POA: Diagnosis not present

## 2021-06-09 DIAGNOSIS — I480 Paroxysmal atrial fibrillation: Secondary | ICD-10-CM | POA: Diagnosis not present

## 2021-06-09 NOTE — Patient Instructions (Addendum)
Testing/Procedures:  Your physician has requested that you have an echocardiogram. Echocardiography is a painless test that uses sound waves to create images of your heart. It provides your doctor with information about the size and shape of your heart and how well your heart's chambers and valves are working. This procedure takes approximately one hour. There are no restrictions for this procedure. [place] [facility]-[address]-1ST FLOOR IMAGING DEPARTMENT   Follow-Up: At [facility], you and your health needs are our priority.  As part of our continuing mission to provide you with exceptional heart care, we have created designated Provider Care Teams.  These Care Teams include your primary Cardiologist (physician) and Advanced Practice Providers (APPs -  Physician Assistants and Nurse Practitioners) who all work together to provide you with the care you need, when you need it.  We recommend signing up for the patient portal called "MyChart".  Sign up information is provided on this After Visit Summary.  MyChart is used to connect with patients for Virtual Visits (Telemedicine).  Patients are able to view lab/test results, encounter notes, upcoming appointments, etc.  Non-urgent messages can be sent to your provider as well.   To learn more about what you can do with MyChart, go to [email or web address].    Your next appointment,  Your physician recommends that you schedule a follow-up appointment in: [place]

## 2021-06-16 IMAGING — DX DG CHEST 2V
2 series · 2 of 2 positions shown · non-contrast
Comparison: 08/17/2018

CLINICAL DATA: 75-year-old male with cough

EXAM:
CHEST - 2 VIEW

[chest pa]
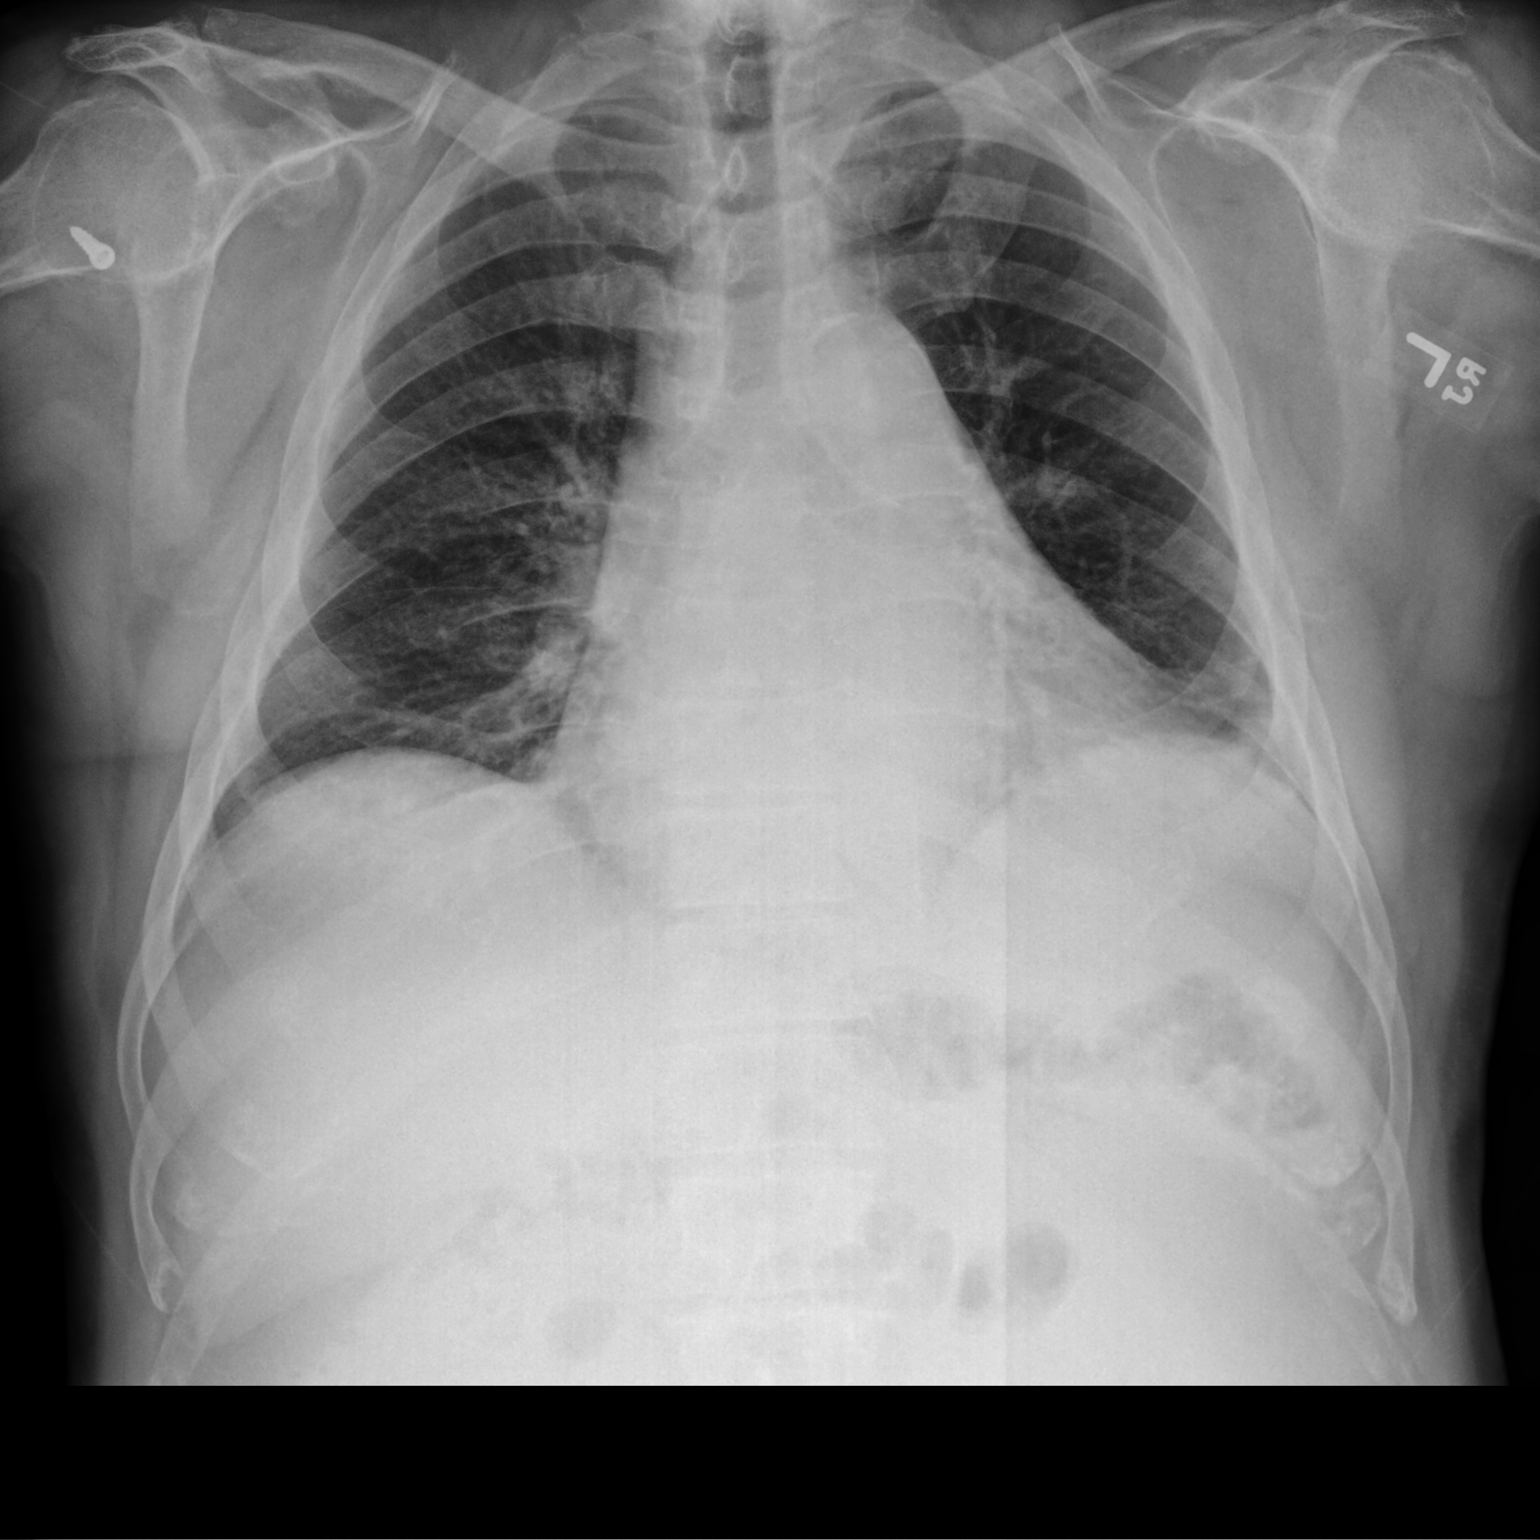

[chest lat]
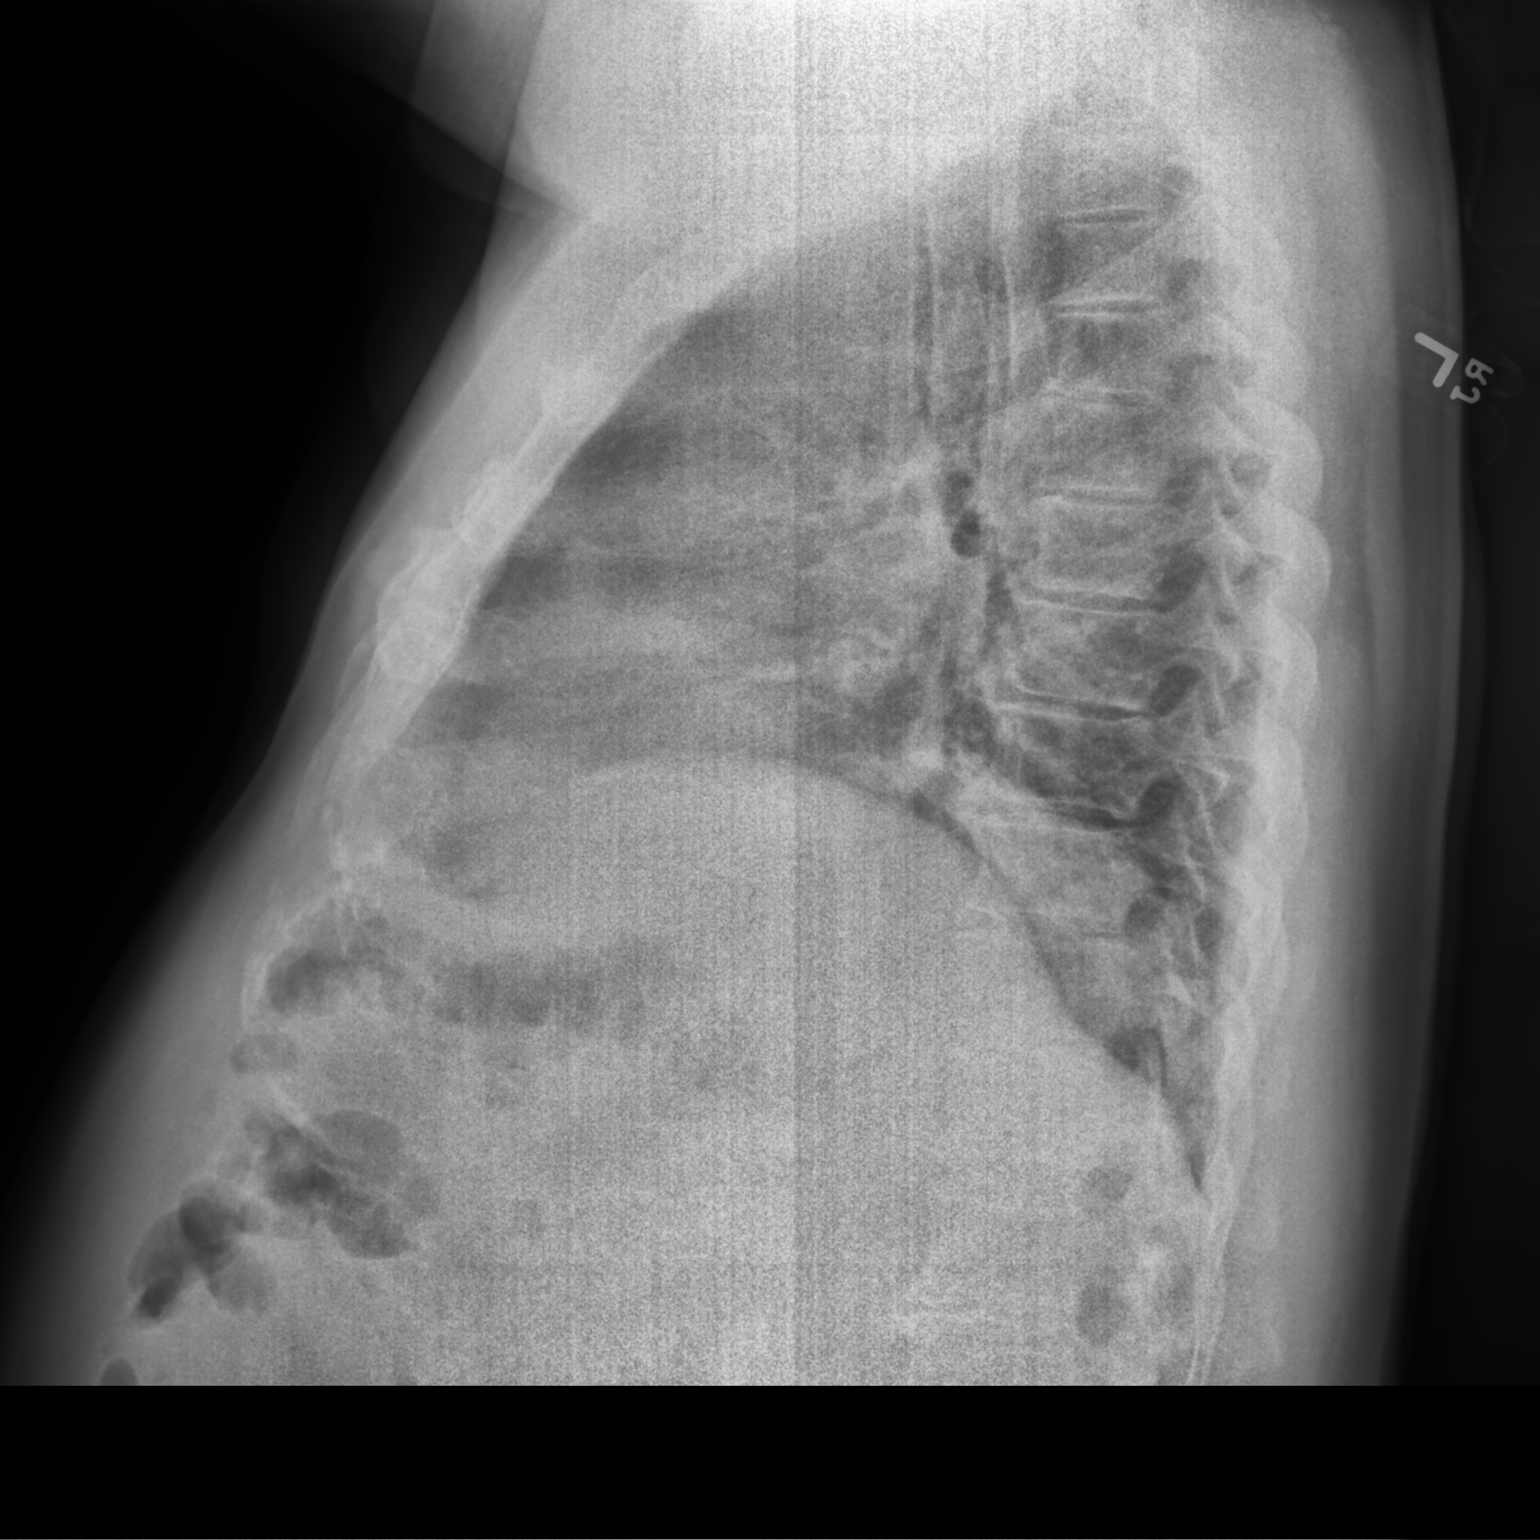

[2 of 2 positions shown; findings below may reference images not displayed]

FINDINGS: Cardiomediastinal silhouette unchanged in size and contour. No
evidence of central vascular congestion. No interlobular septal
thickening. Low lung volumes with likely atelectasis at the lung
bases. No pneumothorax or pleural effusion. No confluent airspace
disease.

No displaced fracture.  Degenerative changes spine
IMPRESSION: Low lung volumes without evidence of acute cardiopulmonary disease

## 2021-06-20 ENCOUNTER — Ambulatory Visit (HOSPITAL_BASED_OUTPATIENT_CLINIC_OR_DEPARTMENT_OTHER): Payer: Medicare HMO

## 2021-08-11 ENCOUNTER — Inpatient Hospital Stay
Admission: AD | Admit: 2021-08-11 | Discharge: 2021-08-22 | Disposition: A | Discharge status: Rehabilitation Facility | Payer: Medicare HMO | Source: Other Acute Inpatient Hospital

## 2021-08-11 ENCOUNTER — Other Ambulatory Visit (HOSPITAL_COMMUNITY): Payer: Self-pay

## 2021-08-11 DIAGNOSIS — J449 Chronic obstructive pulmonary disease, unspecified: Secondary | ICD-10-CM

## 2021-08-11 DIAGNOSIS — J9690 Respiratory failure, unspecified, unspecified whether with hypoxia or hypercapnia: Secondary | ICD-10-CM

## 2021-08-11 DIAGNOSIS — Z431 Encounter for attention to gastrostomy: Secondary | ICD-10-CM

## 2021-08-11 DIAGNOSIS — A419 Sepsis, unspecified organism: Secondary | ICD-10-CM

## 2021-08-11 DIAGNOSIS — I4891 Unspecified atrial fibrillation: Secondary | ICD-10-CM | POA: Diagnosis present

## 2021-08-11 DIAGNOSIS — Z930 Tracheostomy status: Secondary | ICD-10-CM

## 2021-08-11 DIAGNOSIS — J9621 Acute and chronic respiratory failure with hypoxia: Secondary | ICD-10-CM

## 2021-08-11 LAB — BLOOD GAS, ARTERIAL
Acid-Base Excess: 11.3 mmol/L — ABNORMAL HIGH (ref 0.0–2.0)
Bicarbonate: 36.4 mmol/L — ABNORMAL HIGH (ref 20.0–28.0)
FIO2: 40
O2 Saturation: 98.5 %
Patient temperature: 37
pCO2 arterial: 57.8 mmHg — ABNORMAL HIGH (ref 32.0–48.0)
pH, Arterial: 7.415 (ref 7.350–7.450)
pO2, Arterial: 120 mmHg — ABNORMAL HIGH (ref 83.0–108.0)

## 2021-08-11 MED ORDER — DIATRIZOATE MEGLUMINE & SODIUM 66-10 % PO SOLN
ORAL | Status: AC
  Filled 2021-08-11: qty 30

## 2021-08-11 MED ORDER — DIATRIZOATE MEGLUMINE & SODIUM 66-10 % PO SOLN: 30.0000 mL | Freq: Once | ORAL | Status: DC

## 2021-08-12 DIAGNOSIS — A419 Sepsis, unspecified organism: Secondary | ICD-10-CM

## 2021-08-12 DIAGNOSIS — J9621 Acute and chronic respiratory failure with hypoxia: Secondary | ICD-10-CM

## 2021-08-12 DIAGNOSIS — J449 Chronic obstructive pulmonary disease, unspecified: Secondary | ICD-10-CM

## 2021-08-12 DIAGNOSIS — I480 Paroxysmal atrial fibrillation: Secondary | ICD-10-CM | POA: Diagnosis not present

## 2021-08-12 DIAGNOSIS — Z930 Tracheostomy status: Secondary | ICD-10-CM

## 2021-08-12 LAB — COMPREHENSIVE METABOLIC PANEL
ALT: 59 U/L — ABNORMAL HIGH (ref 0–44)
AST: 59 U/L — ABNORMAL HIGH (ref 15–41)
Albumin: 2.8 g/dL — ABNORMAL LOW (ref 3.5–5.0)
Alkaline Phosphatase: 148 U/L — ABNORMAL HIGH (ref 38–126)
Anion gap: 8 (ref 5–15)
BUN: 13 mg/dL (ref 8–23)
CO2: 36 mmol/L — ABNORMAL HIGH (ref 22–32)
Calcium: 8.4 mg/dL — ABNORMAL LOW (ref 8.9–10.3)
Chloride: 94 mmol/L — ABNORMAL LOW (ref 98–111)
Creatinine, Ser: 0.7 mg/dL (ref 0.61–1.24)
GFR, Estimated: >60 mL/min (ref >60)
Glucose, Bld: 152 mg/dL — ABNORMAL HIGH (ref 70–99)
Potassium: 4.2 mmol/L (ref 3.5–5.1)
Sodium: 138 mmol/L (ref 135–145)
Total Bilirubin: 0.6 mg/dL (ref 0.3–1.2)
Total Protein: 6.4 g/dL — ABNORMAL LOW (ref 6.5–8.1)

## 2021-08-12 LAB — CBC WITH DIFFERENTIAL/PLATELET
Abs Immature Granulocytes: 0.02 10*3/uL (ref 0.00–0.07)
Basophils Absolute: 0 10*3/uL (ref 0.0–0.1)
Basophils Relative: 1 %
Eosinophils Absolute: 0.4 10*3/uL (ref 0.0–0.5)
Eosinophils Relative: 7 %
HCT: 33.2 % — ABNORMAL LOW (ref 39.0–52.0)
Hemoglobin: 10.3 g/dL — ABNORMAL LOW (ref 13.0–17.0)
Immature Granulocytes: 0 %
Lymphocytes Relative: 16 %
Lymphs Abs: 0.9 10*3/uL (ref 0.7–4.0)
MCH: 28.1 pg (ref 26.0–34.0)
MCHC: 31 g/dL (ref 30.0–36.0)
MCV: 90.5 fL (ref 80.0–100.0)
Monocytes Absolute: 0.5 10*3/uL (ref 0.1–1.0)
Monocytes Relative: 8 %
Neutro Abs: 3.8 10*3/uL (ref 1.7–7.7)
Neutrophils Relative %: 68 %
Platelets: 235 10*3/uL (ref 150–400)
RBC: 3.67 MIL/uL — ABNORMAL LOW (ref 4.22–5.81)
RDW: 14.7 % (ref 11.5–15.5)
WBC: 5.6 10*3/uL (ref 4.0–10.5)
nRBC: 0 % (ref 0.0–0.2)

## 2021-08-12 LAB — PHOSPHORUS: Phosphorus: 3.9 mg/dL (ref 2.5–4.6)

## 2021-08-12 LAB — PROTIME-INR
INR: 1 (ref 0.8–1.2)
Prothrombin Time: 13.5 seconds (ref 11.4–15.2)

## 2021-08-12 LAB — MAGNESIUM: Magnesium: 2.2 mg/dL (ref 1.7–2.4)

## 2021-08-12 LAB — TSH: TSH: 14.006 u[IU]/mL — ABNORMAL HIGH (ref 0.350–4.500)

## 2021-08-12 LAB — T4, FREE: Free T4: 1 ng/dL (ref 0.61–1.12)

## 2021-08-12 NOTE — Consult Note (Signed)
Pulmonary [place]  PULMONARY SERVICE  Date of Service: 08/12/2021  PULMONARY CRITICAL CARE CONSULT   Barry Escobar  RCV:[id number]  DOB: 1945/10/19   DOA: 08/11/2021  Referring Physician: Satira Sark, MD  HPI: Barry Escobar is a 75 y.o. male seen for follow up of Acute on Chronic Respiratory Failure.  Patient has multiple medical problems including COVID 19 hypertension atrial fibrillation prostate cancer knee surgery who came into the hospital with right flank pain radiating to the abdomen.  Apparently patient was found to have renal stones severe pain was noted at that time.  Patient was evaluated by found on chest x-ray to have a airspace disease with increased agitation.  The patient ended up intubated with some desaturations subsequently was sedated with Precedex and fentanyl.  Patient ended up having to have a tracheostomy done attempted at weaning apparently had been decannulated failed weaning ended up having to have another trach done.  The patient had some complications related to leaking of the tracheostomy.  It appears that patient eventually was placed on T collar and presents to [place] on T collar and 40% FiO2.  Patient does have moderate secretions noted.  Review of Systems:  ROS performed and is unremarkable other than noted above.  Past Medical History:  Diagnosis Date   Asthma    CHF (congestive heart failure) (H[facility])    COVID-19    Hyperlipidemia    Hypertension    PAF (paroxysmal atrial fibrillation) ([place])    Prostate cancer ([place])    Thyroid cancer ([place])     Past Surgical History:  Procedure Laterality Date   KNEE SURGERY     PROSTATECTOMY     THYROIDECTOMY      Social History:    reports that he has never smoked. He has never used smokeless tobacco. He reports current alcohol use of about 3.0 standard drinks per week. He reports that he does not use drugs.  Family History: Non-Contributory to the  present illness  No Known Allergies  Medications: Reviewed on Rounds  Physical Exam:  Vitals: Temperature is 97.0 pulse 89 respiratory 21 blood pressure 139/74 saturations 93%  Ventilator Settings off the ventilator on T collar currently on 40% FiO2  General: Comfortable at this time Eyes: Grossly normal lids, irises & conjunctiva ENT: grossly tongue is normal Neck: no obvious mass Cardiovascular: S1-S2 normal no gallop or rub Respiratory: Scattered rhonchi expansion is equal Abdomen: Soft and nontender Skin: no rash seen on limited exam Musculoskeletal: not rigid Psychiatric:unable to assess Neurologic: no seizure no involuntary movements         Labs on Admission:  Basic Metabolic Panel: Recent Labs  Lab 08/12/21 0331  NA 138  K 4.2  CL 94*  CO2 36*  GLUCOSE 152*  BUN 13  CREATININE 0.70  CALCIUM 8.4*  MG 2.2  PHOS 3.9    Recent Labs  Lab 08/11/21 1702  PHART 7.415  PCO2ART 57.8*  PO2ART 120*  HCO3 36.4*  O2SAT 98.5    Liver Function Tests: Recent Labs  Lab 08/12/21 0331  AST 59*  ALT 59*  ALKPHOS 148*  BILITOT 0.6  PROT 6.4*  ALBUMIN 2.8*   No results for input(s): LIPASE, AMYLASE in the last 168 hours. No results for input(s): AMMONIA in the last 168 hours.  CBC: Recent Labs  Lab 08/12/21 0331  WBC 5.6  NEUTROABS 3.8  HGB 10.3*  HCT 33.2*  MCV 90.5  PLT 235  Cardiac Enzymes: No results for input(s): CKTOTAL, CKMB, CKMBINDEX, TROPONINI in the last 168 hours.  BNP (last 3 results) Recent Labs    03/26/21 1932  BNP 331.4*    ProBNP (last 3 results) Recent Labs    12/13/20 1128  PROBNP 140.0*     Radiological Exams on Admission: DG Chest Port 1 View  Result Date: 08/11/2021 CLINICAL DATA:  Respiratory failure.  Encounter for peg tube. EXAM: PORTABLE CHEST 1 VIEW COMPARISON:  Chest x-ray dated 03/26/2021. FINDINGS: Tracheostomy tube appears grossly well positioned in the midline. Stable cardiomegaly. Stable streaky  atelectasis/scarring at the LEFT lung base. Questionable small pleural effusions bilaterally. Lungs otherwise clear. No evidence of consolidating pneumonia. No pneumothorax is seen. Osseous structures about the chest are unremarkable. IMPRESSION: 1. No evidence of pneumonia or pulmonary edema. Questionable small bilateral pleural effusions. Stable atelectasis/scarring at the LEFT lung base. 2. Stable cardiomegaly. 3. Tracheostomy tube in the midline. Electronically Signed   By: Franki Cabot M.D.   On: 08/11/2021 18:28   DG Abd Portable 1V  Result Date: 08/11/2021 CLINICAL DATA:  Peg tube encounter EXAM: PORTABLE ABDOMEN - 1 VIEW COMPARISON:  07/17/2019 FINDINGS: A hand projects over the upper abdomen, presumably this is the patient's hand. There is retained contrast medium in the tubing of a PEG tube. There is also contrast medium in the small bowel loops adjacent to the stomach and tracking down into the pelvis. There is little in the way of contrast medium in the stomach itself. Correlation with the chronicity of injection of contrast into the tube is suggested in determining whether it should of are ready made its way down into the small bowel. Also consider flushing the contrast out of the PEG tube. IMPRESSION: 1. Contrast medium in the PEG tube and contrast medium in loops of small bowel adjacent to the stomach and extending down into the pelvis. Electronically Signed   By: Van Clines M.D.   On: 08/11/2021 18:29    Assessment/Plan Active Problems:   ATRIAL FIBRILLATION, PAROXYSMAL   Acute on chronic respiratory failure with hypoxia (HCC)   Chronic obstructive pulmonary disease (HCC)   Tracheostomy status (HCC)   Severe sepsis (HCC)   Acute on chronic respiratory failure with hypoxia off the ventilator right now secretions are fairly copious requiring frequent suctioning right now on 40% FiO2 Chronic atrial fibrillation rate is controlled we will continue to monitor along closely.   Continue with pulmonary toilet and supportive care. Chronic obstructive lung disease patient does have a history of bronchospasm asthma we will monitor needs for nebulizers.  We will continue with pulmonary toilet and secretion management Tracheostomy status we will need to keep in place because of excessive secretions we will continue to monitor closely. Severe sepsis now resolved has been treated with antibiotics patient apparently had mild hydronephrosis  I have personally seen and evaluated the patient, evaluated laboratory and imaging results, formulated the assessment and plan and placed orders. The Patient requires high complexity decision making with multiple systems involvement.  Case was discussed on Rounds with the Respiratory Therapy Director and the Respiratory staff Time Spent 74minutes  Donoven Pett A Diannie Willner, MD [facility] Pulmonary Critical Care Medicine Sleep Medicine

## 2021-08-13 DIAGNOSIS — A419 Sepsis, unspecified organism: Secondary | ICD-10-CM

## 2021-08-13 DIAGNOSIS — Z930 Tracheostomy status: Secondary | ICD-10-CM

## 2021-08-13 DIAGNOSIS — R6520 Severe sepsis without septic shock: Secondary | ICD-10-CM

## 2021-08-13 DIAGNOSIS — J9621 Acute and chronic respiratory failure with hypoxia: Secondary | ICD-10-CM

## 2021-08-13 DIAGNOSIS — J449 Chronic obstructive pulmonary disease, unspecified: Secondary | ICD-10-CM

## 2021-08-13 DIAGNOSIS — I480 Paroxysmal atrial fibrillation: Secondary | ICD-10-CM | POA: Diagnosis not present

## 2021-08-13 NOTE — Progress Notes (Signed)
Pulmonary [place]  PROGRESS NOTE     Barry Escobar  URK:[id number]  DOB: 1946-07-02   DOA: 08/11/2021  Referring Physician: Satira Sark, MD  HPI: Barry Escobar is a 75 y.o. male being followed for ventilator/airway/oxygen weaning Acute on Chronic Respiratory Failure.  Patient is doing well on T collar and 35% FiO2 secretions are minimal  Medications: Reviewed on Rounds  Physical Exam:  Vitals: Temperature is 97.7 pulse 85 respiratory is 20 blood pressure is 149/79 saturations 100%  Ventilator Settings on T collar FiO2 35%  General: Comfortable at this time Neck: supple Cardiovascular: no malignant arrhythmias Respiratory: Scattered rhonchi expansion is equal Skin: no rash seen on limited exam Musculoskeletal: No gross abnormality Psychiatric:unable to assess Neurologic:no involuntary movements         Lab Data:   Basic Metabolic Panel: Recent Labs  Lab 08/12/21 0331  NA 138  K 4.2  CL 94*  CO2 36*  GLUCOSE 152*  BUN 13  CREATININE 0.70  CALCIUM 8.4*  MG 2.2  PHOS 3.9    ABG: Recent Labs  Lab 08/11/21 1702  PHART 7.415  PCO2ART 57.8*  PO2ART 120*  HCO3 36.4*  O2SAT 98.5    Liver Function Tests: Recent Labs  Lab 08/12/21 0331  AST 59*  ALT 59*  ALKPHOS 148*  BILITOT 0.6  PROT 6.4*  ALBUMIN 2.8*   No results for input(s): LIPASE, AMYLASE in the last 168 hours. No results for input(s): AMMONIA in the last 168 hours.  CBC: Recent Labs  Lab 08/12/21 0331  WBC 5.6  NEUTROABS 3.8  HGB 10.3*  HCT 33.2*  MCV 90.5  PLT 235    Cardiac Enzymes: No results for input(s): CKTOTAL, CKMB, CKMBINDEX, TROPONINI in the last 168 hours.  BNP (last 3 results) Recent Labs    03/26/21 1932  BNP 331.4*    ProBNP (last 3 results) Recent Labs    12/13/20 1128  PROBNP 140.0*    Radiological Exams: DG Chest Port 1 View  Result Date:  08/11/2021 CLINICAL DATA:  Respiratory failure.  Encounter for peg tube. EXAM: PORTABLE CHEST 1 VIEW COMPARISON:  Chest x-ray dated 03/26/2021. FINDINGS: Tracheostomy tube appears grossly well positioned in the midline. Stable cardiomegaly. Stable streaky atelectasis/scarring at the LEFT lung base. Questionable small pleural effusions bilaterally. Lungs otherwise clear. No evidence of consolidating pneumonia. No pneumothorax is seen. Osseous structures about the chest are unremarkable. IMPRESSION: 1. No evidence of pneumonia or pulmonary edema. Questionable small bilateral pleural effusions. Stable atelectasis/scarring at the LEFT lung base. 2. Stable cardiomegaly. 3. Tracheostomy tube in the midline. Electronically Signed   By: Franki Cabot M.D.   On: 08/11/2021 18:28   DG Abd Portable 1V  Result Date: 08/11/2021 CLINICAL DATA:  Peg tube encounter EXAM: PORTABLE ABDOMEN - 1 VIEW COMPARISON:  07/17/2019 FINDINGS: A hand projects over the upper abdomen, presumably this is the patient's hand. There is retained contrast medium in the tubing of a PEG tube. There is also contrast medium in the small bowel loops adjacent to the stomach and tracking down into the pelvis. There is little in the way of contrast medium in the stomach itself. Correlation with the chronicity of injection of contrast into the tube is suggested in determining whether it should of are ready made its way down into the small bowel. Also consider flushing the contrast out of the PEG tube. IMPRESSION: 1. Contrast medium in the PEG tube  and contrast medium in loops of small bowel adjacent to the stomach and extending down into the pelvis. Electronically Signed   By: Van Clines M.D.   On: 08/11/2021 18:29    Assessment/Plan Active Problems:   ATRIAL FIBRILLATION, PAROXYSMAL   Acute on chronic respiratory failure with hypoxia (HCC)   Chronic obstructive pulmonary disease (HCC)   Tracheostomy status (HCC)   Severe sepsis  (HCC)   Acute on chronic respiratory failure hypoxia plan is to continue with the weaning advance to capping trials today Atrial fibrillation rate controlled we will continue to monitor COPD supportive care Severe sepsis treated resolved Tracheostomy we will start capping   I have personally seen and evaluated the patient, evaluated laboratory and imaging results, formulated the assessment and plan and placed orders. The Patient requires high complexity decision making with multiple systems involvement.  Rounds were done with the Respiratory Therapy Director and Staff therapists and discussed with nursing staff also.  Allyne Gee, MD [facility] Pulmonary Critical Care Medicine Sleep Medicine

## 2021-08-14 ENCOUNTER — Other Ambulatory Visit (HOSPITAL_COMMUNITY): Payer: Self-pay

## 2021-08-14 DIAGNOSIS — J9621 Acute and chronic respiratory failure with hypoxia: Secondary | ICD-10-CM | POA: Diagnosis not present

## 2021-08-14 DIAGNOSIS — A419 Sepsis, unspecified organism: Secondary | ICD-10-CM | POA: Diagnosis not present

## 2021-08-14 DIAGNOSIS — I480 Paroxysmal atrial fibrillation: Secondary | ICD-10-CM | POA: Diagnosis not present

## 2021-08-14 DIAGNOSIS — J449 Chronic obstructive pulmonary disease, unspecified: Secondary | ICD-10-CM | POA: Diagnosis not present

## 2021-08-14 NOTE — Progress Notes (Signed)
Pulmonary [place]  PROGRESS NOTE     Barry Escobar  BLT:[id number]  DOB: 03-Mar-1946   DOA: 08/11/2021  Referring Physician: Satira Sark, MD  HPI: Barry Escobar is a 75 y.o. male being followed for ventilator/airway/oxygen weaning Acute on Chronic Respiratory Failure.  Patient is capping on doing actually pretty well but still with a cuffed trach  Medications: Reviewed on Rounds  Physical Exam:  Vitals: Temperature is 96.9 pulse 88 respiratory is 24 blood pressure is 147/77 saturations 97%  Ventilator Settings capping off the ventilator  General: Comfortable at this time Neck: supple Cardiovascular: no malignant arrhythmias Respiratory: No rhonchi no rales are noted at this time Skin: no rash seen on limited exam Musculoskeletal: No gross abnormality Psychiatric:unable to assess Neurologic:no involuntary movements         Lab Data:   Basic Metabolic Panel: Recent Labs  Lab 08/12/21 0331  NA 138  K 4.2  CL 94*  CO2 36*  GLUCOSE 152*  BUN 13  CREATININE 0.70  CALCIUM 8.4*  MG 2.2  PHOS 3.9    ABG: Recent Labs  Lab 08/11/21 1702  PHART 7.415  PCO2ART 57.8*  PO2ART 120*  HCO3 36.4*  O2SAT 98.5    Liver Function Tests: Recent Labs  Lab 08/12/21 0331  AST 59*  ALT 59*  ALKPHOS 148*  BILITOT 0.6  PROT 6.4*  ALBUMIN 2.8*   No results for input(s): LIPASE, AMYLASE in the last 168 hours. No results for input(s): AMMONIA in the last 168 hours.  CBC: Recent Labs  Lab 08/12/21 0331  WBC 5.6  NEUTROABS 3.8  HGB 10.3*  HCT 33.2*  MCV 90.5  PLT 235    Cardiac Enzymes: No results for input(s): CKTOTAL, CKMB, CKMBINDEX, TROPONINI in the last 168 hours.  BNP (last 3 results) Recent Labs    03/26/21 1932  BNP 331.4*    ProBNP (last 3 results) Recent Labs    12/13/20 1128  PROBNP 140.0*    Radiological Exams: No results  found.  Assessment/Plan Active Problems:   ATRIAL FIBRILLATION, PAROXYSMAL   Acute on chronic respiratory failure with hypoxia (HCC)   Chronic obstructive pulmonary disease (HCC)   Tracheostomy status (HCC)   Severe sepsis (HCC)   Acute on chronic respiratory failure with hypoxia plan is going to be to continue with capping trials for today change trach over to a cuffless trach and hopefully decannulate tomorrow Atrial fibrillation rate is controlled we will continue to monitor closely. COPD medical management Tracheostomy will be changed out Severe sepsis treated resolved   I have personally seen and evaluated the patient, evaluated laboratory and imaging results, formulated the assessment and plan and placed orders. The Patient requires high complexity decision making with multiple systems involvement.  Rounds were done with the Respiratory Therapy Director and Staff therapists and discussed with nursing staff also.  Allyne Gee, MD [facility] Pulmonary Critical Care Medicine Sleep Medicine

## 2021-08-15 DIAGNOSIS — J9621 Acute and chronic respiratory failure with hypoxia: Secondary | ICD-10-CM | POA: Diagnosis not present

## 2021-08-15 DIAGNOSIS — A419 Sepsis, unspecified organism: Secondary | ICD-10-CM | POA: Diagnosis not present

## 2021-08-15 DIAGNOSIS — I480 Paroxysmal atrial fibrillation: Secondary | ICD-10-CM | POA: Diagnosis not present

## 2021-08-15 DIAGNOSIS — J449 Chronic obstructive pulmonary disease, unspecified: Secondary | ICD-10-CM | POA: Diagnosis not present

## 2021-08-15 NOTE — Progress Notes (Signed)
Pulmonary [place]  PROGRESS NOTE     Barry Escobar  FUX:[id number]  DOB: 1945/10/17   DOA: 08/11/2021  Referring Physician: Satira Sark, MD  HPI: Barry Escobar is a 75 y.o. male being followed for ventilator/airway/oxygen weaning Acute on Chronic Respiratory Failure.  Patient is doing well has been capping on room air ready for decannulation today  Medications: Reviewed on Rounds  Physical Exam:  Vitals: Temperature 97.5 pulse 80 respiratory 22 blood pressure is 139/78 saturations 99%  Ventilator Settings capping off the ventilator  General: Comfortable at this time Neck: supple Cardiovascular: no malignant arrhythmias Respiratory: No rhonchi no rales are noted Skin: no rash seen on limited exam Musculoskeletal: No gross abnormality Psychiatric:unable to assess Neurologic:no involuntary movements         Lab Data:   Basic Metabolic Panel: Recent Labs  Lab 08/12/21 0331  NA 138  K 4.2  CL 94*  CO2 36*  GLUCOSE 152*  BUN 13  CREATININE 0.70  CALCIUM 8.4*  MG 2.2  PHOS 3.9    ABG: Recent Labs  Lab 08/11/21 1702  PHART 7.415  PCO2ART 57.8*  PO2ART 120*  HCO3 36.4*  O2SAT 98.5    Liver Function Tests: Recent Labs  Lab 08/12/21 0331  AST 59*  ALT 59*  ALKPHOS 148*  BILITOT 0.6  PROT 6.4*  ALBUMIN 2.8*   No results for input(s): LIPASE, AMYLASE in the last 168 hours. No results for input(s): AMMONIA in the last 168 hours.  CBC: Recent Labs  Lab 08/12/21 0331  WBC 5.6  NEUTROABS 3.8  HGB 10.3*  HCT 33.2*  MCV 90.5  PLT 235    Cardiac Enzymes: No results for input(s): CKTOTAL, CKMB, CKMBINDEX, TROPONINI in the last 168 hours.  BNP (last 3 results) Recent Labs    03/26/21 1932  BNP 331.4*    ProBNP (last 3 results) Recent Labs    12/13/20 1128  PROBNP 140.0*    Radiological Exams: No results found.  Assessment/Plan Active  Problems:   ATRIAL FIBRILLATION, PAROXYSMAL   Acute on chronic respiratory failure with hypoxia (HCC)   Chronic obstructive pulmonary disease (HCC)   Tracheostomy status (HCC)   Severe sepsis (HCC)   Acute on chronic respiratory failure with hypoxia we will proceed to decannulation Atrial fibrillation rate is controlled Chronic obstructive pulmonary disease moderate to severe supportive care Severe sepsis resolved Tracheostomy and has done well with weaning ready for decannulation   I have personally seen and evaluated the patient, evaluated laboratory and imaging results, formulated the assessment and plan and placed orders. The Patient requires high complexity decision making with multiple systems involvement.  Rounds were done with the Respiratory Therapy Director and Staff therapists and discussed with nursing staff also.  Allyne Gee, MD [facility] Pulmonary Critical Care Medicine Sleep Medicine

## 2021-08-16 LAB — CBC
HCT: 33 % — ABNORMAL LOW (ref 39.0–52.0)
Hemoglobin: 10.6 g/dL — ABNORMAL LOW (ref 13.0–17.0)
MCH: 29 pg (ref 26.0–34.0)
MCHC: 32.1 g/dL (ref 30.0–36.0)
MCV: 90.4 fL (ref 80.0–100.0)
Platelets: 290 10*3/uL (ref 150–400)
RBC: 3.65 MIL/uL — ABNORMAL LOW (ref 4.22–5.81)
RDW: 15.4 % (ref 11.5–15.5)
WBC: 5.1 10*3/uL (ref 4.0–10.5)
nRBC: 0 % (ref 0.0–0.2)

## 2021-08-16 LAB — RENAL FUNCTION PANEL
Albumin: 3 g/dL — ABNORMAL LOW (ref 3.5–5.0)
Anion gap: 9 (ref 5–15)
BUN: 16 mg/dL (ref 8–23)
CO2: 34 mmol/L — ABNORMAL HIGH (ref 22–32)
Calcium: 8.7 mg/dL — ABNORMAL LOW (ref 8.9–10.3)
Chloride: 97 mmol/L — ABNORMAL LOW (ref 98–111)
Creatinine, Ser: 0.81 mg/dL (ref 0.61–1.24)
GFR, Estimated: >60 mL/min (ref >60)
Glucose, Bld: 121 mg/dL — ABNORMAL HIGH (ref 70–99)
Phosphorus: 3.6 mg/dL (ref 2.5–4.6)
Potassium: 3.5 mmol/L (ref 3.5–5.1)
Sodium: 140 mmol/L (ref 135–145)

## 2021-08-16 LAB — MAGNESIUM: Magnesium: 1.8 mg/dL (ref 1.7–2.4)

## 2021-08-19 LAB — PHOSPHORUS: Phosphorus: 4.4 mg/dL (ref 2.5–4.6)

## 2021-08-19 LAB — CBC
HCT: 31 % — ABNORMAL LOW (ref 39.0–52.0)
Hemoglobin: 9.9 g/dL — ABNORMAL LOW (ref 13.0–17.0)
MCH: 28.7 pg (ref 26.0–34.0)
MCHC: 31.9 g/dL (ref 30.0–36.0)
MCV: 89.9 fL (ref 80.0–100.0)
Platelets: 279 10*3/uL (ref 150–400)
RBC: 3.45 MIL/uL — ABNORMAL LOW (ref 4.22–5.81)
RDW: 15.6 % — ABNORMAL HIGH (ref 11.5–15.5)
WBC: 4.6 10*3/uL (ref 4.0–10.5)
nRBC: 0 % (ref 0.0–0.2)

## 2021-08-19 LAB — BASIC METABOLIC PANEL
Anion gap: 8 (ref 5–15)
BUN: 18 mg/dL (ref 8–23)
CO2: 33 mmol/L — ABNORMAL HIGH (ref 22–32)
Calcium: 8 mg/dL — ABNORMAL LOW (ref 8.9–10.3)
Chloride: 94 mmol/L — ABNORMAL LOW (ref 98–111)
Creatinine, Ser: 0.82 mg/dL (ref 0.61–1.24)
GFR, Estimated: >60 mL/min (ref >60)
Glucose, Bld: 125 mg/dL — ABNORMAL HIGH (ref 70–99)
Potassium: 3.5 mmol/L (ref 3.5–5.1)
Sodium: 135 mmol/L (ref 135–145)

## 2021-08-19 LAB — MAGNESIUM: Magnesium: 1.9 mg/dL (ref 1.7–2.4)

## 2021-08-20 NOTE — Progress Notes (Deleted)
HPI: FU dyspnea. Pt with h/o PAF s/p ablation 2015.  He had a cardiac catheterization 2010 revealing normal arteries by report.  Patient has had progressive dyspnea since COVID April 2021.  Pulmonary function tests in the past apparently have shown severe restrictive impairment but normal DLCO. Apparently CTs in the past have not demonstrated interstitial lung disease.  Nuclear study at [facility] September 2022 showed no ischemia, diaphragmatic attenuation and ejection fraction 78%. Echo 10/22 at [facility] showed normal LV function, mild AI, mild AS with mean gradient 15 mmHg, mild to moderate MR, mild TR. Pt with acute on chronic respiratory failure and now with tracheostomy. Since last seen,   No current facility-administered medications for this visit.   No current outpatient medications on file.   Facility-Administered Medications Ordered in Other Visits  Medication Dose Route Frequency Provider Last Rate Last Admin   diatrizoate meglumine-sodium (GASTROGRAFIN) 66-10 % solution 30 mL  30 mL Per Tube Once Janora Norlander, NP         Past Medical History:  Diagnosis Date   Asthma    CHF (congestive heart failure) ([place])    COVID-19    Hyperlipidemia    Hypertension    PAF (paroxysmal atrial fibrillation) (HCC)    Prostate cancer ([place])    Thyroid cancer ([place])     Past Surgical History:  Procedure Laterality Date   KNEE SURGERY     PROSTATECTOMY     THYROIDECTOMY      Social History   Socioeconomic History   Marital status: Divorced    Spouse name: Not on file   Number of children: 2   Years of education: Not on file   Highest education level: Not on file  Occupational History   Not on file  Tobacco Use   Smoking status: Never   Smokeless tobacco: Never  Substance and Sexual Activity   Alcohol use: Yes    Alcohol/week: 3.0 standard drinks    Types: 3 Glasses of wine per week    Comment: 3 drinks per day   Drug use: Never   Sexual activity: Not on file  Other  Topics Concern   Not on file  Social History Narrative   Not on file   Social Determinants of Health   Financial Resource Strain: Not on file  Food Insecurity: Not on file  Transportation Needs: Not on file  Physical Activity: Not on file  Stress: Not on file  Social Connections: Not on file  Intimate Partner Violence: Not on file    Family History  Problem Relation Age of Onset   Cancer Mother    Cancer Father     ROS: no fevers or chills, productive cough, hemoptysis, dysphasia, odynophagia, melena, hematochezia, dysuria, hematuria, rash, seizure activity, orthopnea, PND, pedal edema, claudication. Remaining systems are negative.  Physical Exam: Well-developed well-nourished in no acute distress.  Skin is warm and dry.  HEENT is normal.  Neck is supple.  Chest is clear to auscultation with normal expansion.  Cardiovascular exam is regular rate and rhythm.  Abdominal exam nontender or distended. No masses palpated. Extremities show no edema. neuro grossly intact  ECG- personally reviewed  A/P  1 Dyspnea/respiratory failure-recent echo with normal LV function and nuclear study with no ischemia; not volume overloaded on exam. Appears to be primarily pulmonary issue.   2 PAF-s/p ablation; continue flecanide.  3 hypertension-BP controlled; continue present meds and follow.  4 hyperlipidemia-continue statin.  Kirk Ruths, MD

## 2021-08-27 ENCOUNTER — Ambulatory Visit: Payer: No Typology Code available for payment source | Admitting: Cardiology

## 2023-04-01 DIAGNOSIS — I5189 Other ill-defined heart diseases: Secondary | ICD-10-CM | POA: Diagnosis not present

## 2023-04-01 DIAGNOSIS — I480 Paroxysmal atrial fibrillation: Secondary | ICD-10-CM | POA: Diagnosis not present

## 2023-04-01 DIAGNOSIS — I350 Nonrheumatic aortic (valve) stenosis: Secondary | ICD-10-CM | POA: Diagnosis not present

## 2023-04-01 DIAGNOSIS — I5032 Chronic diastolic (congestive) heart failure: Secondary | ICD-10-CM | POA: Diagnosis not present

## 2023-04-01 DIAGNOSIS — E785 Hyperlipidemia, unspecified: Secondary | ICD-10-CM | POA: Diagnosis not present

## 2023-04-01 DIAGNOSIS — I10 Essential (primary) hypertension: Secondary | ICD-10-CM | POA: Diagnosis not present

## 2023-04-01 DIAGNOSIS — R0609 Other forms of dyspnea: Secondary | ICD-10-CM | POA: Diagnosis not present

## 2023-04-06 DIAGNOSIS — C61 Malignant neoplasm of prostate: Secondary | ICD-10-CM | POA: Diagnosis not present

## 2023-04-21 DIAGNOSIS — D692 Other nonthrombocytopenic purpura: Secondary | ICD-10-CM | POA: Diagnosis not present

## 2023-04-21 DIAGNOSIS — L821 Other seborrheic keratosis: Secondary | ICD-10-CM | POA: Diagnosis not present

## 2023-08-16 DIAGNOSIS — G4733 Obstructive sleep apnea (adult) (pediatric): Secondary | ICD-10-CM | POA: Diagnosis not present

## 2023-08-16 DIAGNOSIS — I10 Essential (primary) hypertension: Secondary | ICD-10-CM | POA: Diagnosis not present

## 2023-08-16 DIAGNOSIS — J9611 Chronic respiratory failure with hypoxia: Secondary | ICD-10-CM | POA: Diagnosis not present

## 2023-08-16 DIAGNOSIS — J4540 Moderate persistent asthma, uncomplicated: Secondary | ICD-10-CM | POA: Diagnosis not present

## 2023-09-02 DIAGNOSIS — S0181XA Laceration without foreign body of other part of head, initial encounter: Secondary | ICD-10-CM | POA: Diagnosis not present

## 2023-09-02 DIAGNOSIS — I2510 Atherosclerotic heart disease of native coronary artery without angina pectoris: Secondary | ICD-10-CM | POA: Diagnosis not present

## 2023-09-02 DIAGNOSIS — E876 Hypokalemia: Secondary | ICD-10-CM | POA: Diagnosis not present

## 2023-09-02 DIAGNOSIS — R0989 Other specified symptoms and signs involving the circulatory and respiratory systems: Secondary | ICD-10-CM | POA: Diagnosis not present

## 2023-09-02 DIAGNOSIS — M7989 Other specified soft tissue disorders: Secondary | ICD-10-CM | POA: Diagnosis not present

## 2023-09-02 DIAGNOSIS — I4891 Unspecified atrial fibrillation: Secondary | ICD-10-CM | POA: Diagnosis not present

## 2023-09-02 DIAGNOSIS — E7800 Pure hypercholesterolemia, unspecified: Secondary | ICD-10-CM | POA: Diagnosis not present

## 2023-09-02 DIAGNOSIS — R9431 Abnormal electrocardiogram [ECG] [EKG]: Secondary | ICD-10-CM | POA: Diagnosis not present

## 2023-09-02 DIAGNOSIS — R059 Cough, unspecified: Secondary | ICD-10-CM | POA: Diagnosis not present

## 2023-09-02 DIAGNOSIS — I517 Cardiomegaly: Secondary | ICD-10-CM | POA: Diagnosis not present

## 2023-09-02 DIAGNOSIS — R55 Syncope and collapse: Secondary | ICD-10-CM | POA: Diagnosis not present

## 2023-09-02 DIAGNOSIS — I110 Hypertensive heart disease with heart failure: Secondary | ICD-10-CM | POA: Diagnosis not present

## 2023-09-02 DIAGNOSIS — R9082 White matter disease, unspecified: Secondary | ICD-10-CM | POA: Diagnosis not present

## 2023-09-02 DIAGNOSIS — R0602 Shortness of breath: Secondary | ICD-10-CM | POA: Diagnosis not present

## 2023-09-02 DIAGNOSIS — R0902 Hypoxemia: Secondary | ICD-10-CM | POA: Diagnosis not present

## 2023-09-02 DIAGNOSIS — J449 Chronic obstructive pulmonary disease, unspecified: Secondary | ICD-10-CM | POA: Diagnosis not present

## 2023-09-02 DIAGNOSIS — Z791 Long term (current) use of non-steroidal anti-inflammatories (NSAID): Secondary | ICD-10-CM | POA: Diagnosis not present

## 2023-09-02 DIAGNOSIS — S8001XA Contusion of right knee, initial encounter: Secondary | ICD-10-CM | POA: Diagnosis not present

## 2023-09-02 DIAGNOSIS — R4182 Altered mental status, unspecified: Secondary | ICD-10-CM | POA: Diagnosis not present

## 2023-09-02 DIAGNOSIS — I509 Heart failure, unspecified: Secondary | ICD-10-CM | POA: Diagnosis not present

## 2023-09-02 DIAGNOSIS — Z888 Allergy status to other drugs, medicaments and biological substances status: Secondary | ICD-10-CM | POA: Diagnosis not present

## 2023-09-02 DIAGNOSIS — J984 Other disorders of lung: Secondary | ICD-10-CM | POA: Diagnosis not present

## 2023-09-03 DIAGNOSIS — I2510 Atherosclerotic heart disease of native coronary artery without angina pectoris: Secondary | ICD-10-CM | POA: Diagnosis not present

## 2023-09-03 DIAGNOSIS — R9082 White matter disease, unspecified: Secondary | ICD-10-CM | POA: Diagnosis not present

## 2023-09-03 DIAGNOSIS — R0602 Shortness of breath: Secondary | ICD-10-CM | POA: Diagnosis not present

## 2023-09-03 DIAGNOSIS — I517 Cardiomegaly: Secondary | ICD-10-CM | POA: Diagnosis not present

## 2023-09-03 DIAGNOSIS — R4182 Altered mental status, unspecified: Secondary | ICD-10-CM | POA: Diagnosis not present

## 2023-09-03 DIAGNOSIS — M7989 Other specified soft tissue disorders: Secondary | ICD-10-CM | POA: Diagnosis not present

## 2023-09-03 DIAGNOSIS — R0989 Other specified symptoms and signs involving the circulatory and respiratory systems: Secondary | ICD-10-CM | POA: Diagnosis not present

## 2023-09-03 DIAGNOSIS — J984 Other disorders of lung: Secondary | ICD-10-CM | POA: Diagnosis not present

## 2023-09-23 DIAGNOSIS — H6592 Unspecified nonsuppurative otitis media, left ear: Secondary | ICD-10-CM | POA: Diagnosis not present

## 2023-09-23 DIAGNOSIS — H90A21 Sensorineural hearing loss, unilateral, right ear, with restricted hearing on the contralateral side: Secondary | ICD-10-CM | POA: Diagnosis not present

## 2023-09-23 DIAGNOSIS — H90A32 Mixed conductive and sensorineural hearing loss, unilateral, left ear with restricted hearing on the contralateral side: Secondary | ICD-10-CM | POA: Diagnosis not present

## 2023-10-08 DIAGNOSIS — I5189 Other ill-defined heart diseases: Secondary | ICD-10-CM | POA: Diagnosis not present

## 2023-10-08 DIAGNOSIS — I5032 Chronic diastolic (congestive) heart failure: Secondary | ICD-10-CM | POA: Diagnosis not present

## 2023-10-08 DIAGNOSIS — I350 Nonrheumatic aortic (valve) stenosis: Secondary | ICD-10-CM | POA: Diagnosis not present

## 2023-10-08 DIAGNOSIS — R0609 Other forms of dyspnea: Secondary | ICD-10-CM | POA: Diagnosis not present

## 2023-10-26 DIAGNOSIS — Z1330 Encounter for screening examination for mental health and behavioral disorders, unspecified: Secondary | ICD-10-CM | POA: Diagnosis not present

## 2023-10-26 DIAGNOSIS — I10 Essential (primary) hypertension: Secondary | ICD-10-CM | POA: Diagnosis not present

## 2023-10-26 DIAGNOSIS — I340 Nonrheumatic mitral (valve) insufficiency: Secondary | ICD-10-CM | POA: Diagnosis not present

## 2023-10-26 DIAGNOSIS — I480 Paroxysmal atrial fibrillation: Secondary | ICD-10-CM | POA: Diagnosis not present

## 2023-10-26 DIAGNOSIS — I5032 Chronic diastolic (congestive) heart failure: Secondary | ICD-10-CM | POA: Diagnosis not present

## 2023-10-26 DIAGNOSIS — R0609 Other forms of dyspnea: Secondary | ICD-10-CM | POA: Diagnosis not present

## 2023-10-26 DIAGNOSIS — E785 Hyperlipidemia, unspecified: Secondary | ICD-10-CM | POA: Diagnosis not present

## 2023-10-26 DIAGNOSIS — I350 Nonrheumatic aortic (valve) stenosis: Secondary | ICD-10-CM | POA: Diagnosis not present

## 2024-01-06 DIAGNOSIS — E892 Postprocedural hypoparathyroidism: Secondary | ICD-10-CM | POA: Diagnosis not present

## 2024-01-06 DIAGNOSIS — E890 Postprocedural hypothyroidism: Secondary | ICD-10-CM | POA: Diagnosis not present

## 2024-01-06 DIAGNOSIS — C73 Malignant neoplasm of thyroid gland: Secondary | ICD-10-CM | POA: Diagnosis not present

## 2024-01-06 DIAGNOSIS — I10 Essential (primary) hypertension: Secondary | ICD-10-CM | POA: Diagnosis not present

## 2024-01-19 NOTE — Progress Notes (Signed)
 HPI: FU atrial fibrillation.  Last seen June 09, 2021.  Since that time has been followed at [facility].  Pt with h/o PAF s/p ablation 2015.  He had a cardiac catheterization 2010 revealing normal arteries by report.  Patient has had progressive dyspnea since COVID April 2021.  Pulmonary function tests in the past apparently have shown severe restrictive impairment but normal DLCO.  Stress nuclear study in 2024 at [facility] showed normal perfusion with ejection fraction 70%.  High-resolution chest CT June 2024 at [facility] showed no interstitial lung disease though there was possible pulmonary hypertension.  Chest CT December 2024 at [facility] showed no pulmonary embolus. Echocardiogram January 2025 at [facility] showed normal LV function, mild aortic insufficiency, moderate aortic stenosis with mean gradient 25 mmHg, mild to moderate mitral regurgitation, mild tricuspid regurgitation.  Since last seen patient does note dyspnea on exertion that he feels is worse.  No orthopnea, PND, chest pain or syncope.  Minimal pedal edema.  Current Outpatient Medications  Medication Sig Dispense Refill   albuterol  (VENTOLIN  HFA) 108 (90 Base) MCG/ACT inhaler Inhale into the lungs.     atorvastatin (LIPITOR) 40 MG tablet Take by mouth.     flecainide  (TAMBOCOR ) 50 MG tablet Take 1 tablet (50 mg total) by mouth 2 (two) times daily. Needs ov for further refills 62 tablet 2   Fluticasone-Salmeterol (WIXELA INHUB IN) Inhale 1 puff into the lungs in the morning and at bedtime.     levothyroxine (SYNTHROID) 175 MCG tablet Take 1 tablet by mouth daily.     No current facility-administered medications for this visit.     Past Medical History:  Diagnosis Date   Asthma    CHF (congestive heart failure) (HCC)    COVID-19    Hyperlipidemia    Hypertension    PAF (paroxysmal atrial fibrillation) (HCC)    Prostate cancer (HCC)    Thyroid  cancer (HCC)     Past Surgical History:  Procedure Laterality Date   KNEE SURGERY      PROSTATECTOMY     THYROIDECTOMY      Social History   Socioeconomic History   Marital status: Divorced    Spouse name: Not on file   Number of children: 2   Years of education: Not on file   Highest education level: Not on file  Occupational History   Not on file  Tobacco Use   Smoking status: Never   Smokeless tobacco: Never  Substance and Sexual Activity   Alcohol use: Yes    Alcohol/week: 3.0 standard drinks of alcohol    Types: 3 Glasses of wine per week    Comment: 3 drinks per day   Drug use: Never   Sexual activity: Not on file  Other Topics Concern   Not on file  Social History Narrative   Not on file   Social Drivers of Health   Financial Resource Strain: Low Risk  (12/15/2023)   Received [facility]   Overall Financial Resource Strain (CARDIA)    Difficulty of Paying Living Expenses: Not very hard  Food Insecurity: No Food Insecurity (12/15/2023)   [facility] from [facility]   Hunger Vital Sign    Worried About Running Out of Food in the Last Year: Never true    Ran Out of Food in the Last Year: Never true  Transportation Needs: No Transportation Needs (12/15/2023)   Received from [facility] - Transportation    Lack of Transportation (Medical): No  Lack of Transportation (Non-Medical): No  Physical Activity: Not on file  Stress: No Stress Concern Present (12/22/2022)   Received from [facility] - Occupational Stress Questionnaire    Feeling of Stress : Not at all  Social Connections: Not on file  Intimate Partner Violence: Not At Risk (01/20/2024)   Received from [facility]   HITS    Over the last 12 months how often did your partner physically hurt you?: Never    Over the last 12 months how often did your partner insult you or talk down to you?: Never    Over the last 12 months how often did your partner threaten you with physical harm?: Never    Over the last 12 months how often did  your partner scream or curse at you?: Never    Family History  Problem Relation Age of Onset   Cancer Mother    Cancer Father     ROS: no fevers or chills, productive cough, hemoptysis, dysphasia, odynophagia, melena, hematochezia, dysuria, hematuria, rash, seizure activity, orthopnea, PND, claudication. Remaining systems are negative.  Physical Exam: Well-developed well-nourished in no acute distress.  Skin is warm and dry.  HEENT is normal.  Neck is supple.  Chest is clear to auscultation with normal expansion.  Cardiovascular exam is regular rate and rhythm.  3/6 systolic murmur left sternal border.  S2 is not diminished. Abdominal exam nontender or distended. No masses palpated. Extremities show no edema. neuro grossly intact  EKG Interpretation Date/Time:  Wednesday Feb 02 2024 15:53:41 EDT Ventricular Rate:  78 PR Interval:  152 QRS Duration:  122 QT Interval:  406 QTC Calculation: 462 R Axis:   22  Text Interpretation: Normal sinus rhythm Non-specific intra-ventricular conduction delay Nonspecific ST and T wave abnormality Confirmed by Alexandria Angel ([id number]) on 02/02/2024 4:03:13 PM    A/P  1 aortic stenosis-moderate on most recent echocardiogram at [facility].  However he had a more recent study several weeks ago at the [place].  We will obtain those results.  He states he was told it was moderate to severe.  However on exam it sounds more moderate.  He will be difficult to assess as he has worsening dyspnea on exertion but also has lung disease which is followed by pulmonary.  I have explained that he will likely require aortic valve replacement in the future.  I will see him back in 3 months once we obtain the results of his most recent echocardiogram.  2 history of chronic diastolic congestive heart failure-patient is euvolemic today.  Continue Lasix  at present dose.  3 hypertension-blood pressure controlled.  Continue present medications.  4 hyperlipidemia-continue  statin.  5 history of paroxysmal atrial fibrillation-status post ablation with no recurrences.  Continue flecainide .  6 dyspnea-this is felt to be multifactorial including history of diastolic dysfunction with underlying pulmonary disease including asthma/COPD and pulmonary fibrosis.  Alexandria Angel, MD

## 2024-01-20 DIAGNOSIS — J810 Acute pulmonary edema: Secondary | ICD-10-CM | POA: Diagnosis not present

## 2024-01-20 DIAGNOSIS — R918 Other nonspecific abnormal finding of lung field: Secondary | ICD-10-CM | POA: Diagnosis not present

## 2024-01-20 DIAGNOSIS — Z7951 Long term (current) use of inhaled steroids: Secondary | ICD-10-CM | POA: Diagnosis not present

## 2024-01-20 DIAGNOSIS — Z79899 Other long term (current) drug therapy: Secondary | ICD-10-CM | POA: Diagnosis not present

## 2024-01-20 DIAGNOSIS — I480 Paroxysmal atrial fibrillation: Secondary | ICD-10-CM | POA: Diagnosis not present

## 2024-01-20 DIAGNOSIS — I5033 Acute on chronic diastolic (congestive) heart failure: Secondary | ICD-10-CM | POA: Diagnosis not present

## 2024-01-20 DIAGNOSIS — I5032 Chronic diastolic (congestive) heart failure: Secondary | ICD-10-CM | POA: Diagnosis not present

## 2024-01-20 DIAGNOSIS — Z85850 Personal history of malignant neoplasm of thyroid: Secondary | ICD-10-CM | POA: Diagnosis not present

## 2024-01-20 DIAGNOSIS — Z888 Allergy status to other drugs, medicaments and biological substances status: Secondary | ICD-10-CM | POA: Diagnosis not present

## 2024-01-20 DIAGNOSIS — Z8546 Personal history of malignant neoplasm of prostate: Secondary | ICD-10-CM | POA: Diagnosis not present

## 2024-01-20 DIAGNOSIS — R0602 Shortness of breath: Secondary | ICD-10-CM | POA: Diagnosis not present

## 2024-01-20 DIAGNOSIS — E890 Postprocedural hypothyroidism: Secondary | ICD-10-CM | POA: Diagnosis not present

## 2024-02-02 ENCOUNTER — Ambulatory Visit (INDEPENDENT_AMBULATORY_CARE_PROVIDER_SITE_OTHER): Admitting: Cardiology

## 2024-02-02 ENCOUNTER — Encounter: Payer: Self-pay | Admitting: Cardiology

## 2024-02-02 VITALS — BP 122/78 | HR 78 | Ht 67.0 in | Wt 232.0 lb

## 2024-02-02 DIAGNOSIS — I350 Nonrheumatic aortic (valve) stenosis: Secondary | ICD-10-CM | POA: Diagnosis not present

## 2024-02-02 DIAGNOSIS — I480 Paroxysmal atrial fibrillation: Secondary | ICD-10-CM | POA: Diagnosis not present

## 2024-02-02 DIAGNOSIS — R0609 Other forms of dyspnea: Secondary | ICD-10-CM | POA: Diagnosis not present

## 2024-02-02 NOTE — Patient Instructions (Signed)
  Follow-Up: At [facility], you and your health needs are our priority.  As part of our continuing mission to provide you with exceptional heart care, our providers are all part of one team.  This team includes your primary Cardiologist (physician) and Advanced Practice Providers or APPs (Physician Assistants and Nurse Practitioners) who all work together to provide you with the care you need, when you need it.  Your next appointment:   3 month(s)  Provider:   Alexandria Angel, MD

## 2024-02-14 DIAGNOSIS — Z85828 Personal history of other malignant neoplasm of skin: Secondary | ICD-10-CM | POA: Diagnosis not present

## 2024-02-14 DIAGNOSIS — L820 Inflamed seborrheic keratosis: Secondary | ICD-10-CM | POA: Diagnosis not present

## 2024-02-14 DIAGNOSIS — L821 Other seborrheic keratosis: Secondary | ICD-10-CM | POA: Diagnosis not present

## 2024-03-02 DIAGNOSIS — R490 Dysphonia: Secondary | ICD-10-CM | POA: Diagnosis not present

## 2024-05-24 ENCOUNTER — Ambulatory Visit: Admitting: Cardiology
